# Patient Record
Sex: Female | Born: 1950 | Marital: Married | Smoking: Former smoker
Health system: Southern US, Community
[De-identification: ages and names within clinical notes are randomized; demographics above are authoritative.]

## PROBLEM LIST (undated history)

## (undated) DIAGNOSIS — E049 Nontoxic goiter, unspecified: Principal | ICD-10-CM

## (undated) HISTORY — PX: TONSILECTOMY, ADENOIDECTOMY, BILATERAL MYRINGOTOMY AND TUBES: SHX2538

## (undated) HISTORY — DX: Nontoxic goiter, unspecified: E04.9

## (undated) HISTORY — PX: DG GALL BLADDER: HXRAD326

---

## 2013-06-20 ENCOUNTER — Emergency Department: Payer: Self-pay | Admitting: Emergency Medicine

## 2013-06-29 ENCOUNTER — Inpatient Hospital Stay: Payer: Self-pay | Admitting: Internal Medicine

## 2013-06-29 LAB — CBC
HGB: 12.5 g/dL (ref 12.0–16.0)
MCH: 31.1 pg (ref 26.0–34.0)
MCHC: 34 g/dL (ref 32.0–36.0)
MCV: 92 fL (ref 80–100)
Platelet: 362 10*3/uL (ref 150–440)
RBC: 4.02 10*6/uL (ref 3.80–5.20)
RDW: 13 % (ref 11.5–14.5)
WBC: 19.4 10*3/uL — ABNORMAL HIGH (ref 3.6–11.0)

## 2013-06-29 LAB — COMPREHENSIVE METABOLIC PANEL
Albumin: 3.3 g/dL — ABNORMAL LOW (ref 3.4–5.0)
Alkaline Phosphatase: 103 U/L (ref 50–136)
Anion Gap: 7 (ref 7–16)
Chloride: 103 mmol/L (ref 98–107)
Creatinine: 1.15 mg/dL (ref 0.60–1.30)
EGFR (African American): >60
Glucose: 106 mg/dL — ABNORMAL HIGH (ref 65–99)
Osmolality: 278 (ref 275–301)
Potassium: 2.7 mmol/L — ABNORMAL LOW (ref 3.5–5.1)
Total Protein: 6.9 g/dL (ref 6.4–8.2)

## 2013-06-29 LAB — CBC WITH DIFFERENTIAL/PLATELET
HCT: 35.5 % (ref 35.0–47.0)
HGB: 11.9 g/dL — ABNORMAL LOW (ref 12.0–16.0)
Lymphocyte #: 2 10*3/uL (ref 1.0–3.6)
Lymphocyte %: 11.1 %
MCH: 31.4 pg (ref 26.0–34.0)
MCHC: 33.6 g/dL (ref 32.0–36.0)
MCV: 93 fL (ref 80–100)
Monocyte #: 0.7 x10 3/mm (ref 0.2–0.9)
Monocyte %: 3.6 %
Neutrophil #: 15.4 10*3/uL — ABNORMAL HIGH (ref 1.4–6.5)
Neutrophil %: 83.9 %
Platelet: 356 10*3/uL (ref 150–440)
RBC: 3.8 10*6/uL (ref 3.80–5.20)
WBC: 18.3 10*3/uL — ABNORMAL HIGH (ref 3.6–11.0)

## 2013-06-29 LAB — SEDIMENTATION RATE: Erythrocyte Sed Rate: 23 mm/hr (ref 0–30)

## 2013-06-30 LAB — CBC WITH DIFFERENTIAL/PLATELET
Basophil #: 0 10*3/uL (ref 0.0–0.1)
Basophil %: 0.4 %
Eosinophil #: 0.3 10*3/uL (ref 0.0–0.7)
HCT: 35.5 % (ref 35.0–47.0)
MCH: 31.6 pg (ref 26.0–34.0)
MCHC: 34.4 g/dL (ref 32.0–36.0)
Monocyte %: 5.6 %
Neutrophil #: 9.6 10*3/uL — ABNORMAL HIGH (ref 1.4–6.5)
Neutrophil %: 71.9 %
Platelet: 342 10*3/uL (ref 150–440)
RBC: 3.86 10*6/uL (ref 3.80–5.20)

## 2013-06-30 LAB — BASIC METABOLIC PANEL
Anion Gap: 6 — ABNORMAL LOW (ref 7–16)
BUN: 8 mg/dL (ref 7–18)
Calcium, Total: 8.5 mg/dL (ref 8.5–10.1)
Chloride: 106 mmol/L (ref 98–107)
Creatinine: 0.9 mg/dL (ref 0.60–1.30)
EGFR (African American): >60
EGFR (Non-African Amer.): >60
Glucose: 85 mg/dL (ref 65–99)
Osmolality: 281 (ref 275–301)
Potassium: 2.7 mmol/L — ABNORMAL LOW (ref 3.5–5.1)

## 2013-07-01 LAB — MAGNESIUM: Magnesium: 1.6 mg/dL — ABNORMAL LOW

## 2013-07-01 LAB — VANCOMYCIN, TROUGH: Vancomycin, Trough: 11 ug/mL (ref 10–20)

## 2013-07-01 LAB — POTASSIUM: Potassium: 2.9 mmol/L — ABNORMAL LOW (ref 3.5–5.1)

## 2013-07-02 DIAGNOSIS — Z01810 Encounter for preprocedural cardiovascular examination: Secondary | ICD-10-CM

## 2013-07-02 LAB — BASIC METABOLIC PANEL
Chloride: 104 mmol/L (ref 98–107)
Co2: 28 mmol/L (ref 21–32)
EGFR (African American): >60
Osmolality: 276 (ref 275–301)

## 2013-07-02 LAB — WBC: WBC: 10.7 10*3/uL (ref 3.6–11.0)

## 2013-07-03 LAB — CBC WITH DIFFERENTIAL/PLATELET
Basophil %: 1.2 %
Eosinophil #: 0.5 10*3/uL (ref 0.0–0.7)
HCT: 38.4 % (ref 35.0–47.0)
HGB: 13.1 g/dL (ref 12.0–16.0)
MCHC: 34 g/dL (ref 32.0–36.0)
MCV: 93 fL (ref 80–100)
Monocyte #: 0.7 x10 3/mm (ref 0.2–0.9)
Monocyte %: 7.7 %
Neutrophil %: 61 %
RDW: 13.2 % (ref 11.5–14.5)

## 2013-07-04 LAB — WOUND CULTURE

## 2013-07-07 LAB — WOUND CULTURE

## 2015-02-02 NOTE — Consult Note (Signed)
PATIENT NAME:  Linda Linda [name], Linda Linda [name] MR#:  [id number] DATE OF BIRTH:  06-23-1951  DATE OF CONSULTATION:  06/30/2013  CONSULTING PHYSICIAN:  Jill AlexandersJustin Linda [name]. Ether GriffinsFowler, DPM  REASON FOR CONSULTATION: Right great toe abscess.   HISTORY OF PRESENT ILLNESS: This is Linda [name] 64 year old female admitted with cellulitis to the  right great toe. She states it just came on about 3 days ago; she noticed redness and swelling to the right great toe. She apparently was on Bactrim therapy through the ER recently. I was consulted for possible abscess to this right great toe area.   PAST MEDICAL HISTORY: Type 2 diabetes, asthma, fibromyalgia.   HOME MEDICATIONS: Percocet, recent Bactrim.   ALLERGIES: CODEINE AND LATEX.   SOCIAL HISTORY: She denies smoking or alcohol. She is currently homeless and living in [name] shelter for the last 5 weeks.   REVIEW OF SYSTEMS: No fever, chills, shortness of breath.   PHYSICAL EXAMINATION:  GENERAL: She is alert and oriented.  VASCULAR: She has fully palpable DP and PT pulses to her bilateral feet.  NEUROLOGIC: She is fully sensate to the lower extremities. DERMATOLOGIC: On the dorsal aspect of her right great toe, she has Linda [name] noticed focal abscessed area with surrounding cellulitis and erythema to the great toe. There is Linda [name] central scab in the area. She is extremely tender with any attempted palpation to the region. She has diffuse edema to this right lower extremity as well.  MUSCULOSKELETAL: No pain on range of motion in ankle subtalar joint, but extreme pain with range of motion of the right great toe.   RADIOLOGIC DATA: X-rays were negative for any fracture or gas in the soft tissues.   ASSESSMENT: Right great toe abscess.   PLAN: I discussed performing Linda [name] bedside I and D with the patient and consent has been given.     ____________________________ Linda DonovanJustin Linda [name]. Ether GriffinsFowler, DPM jaf:np D: 06/30/2013 18:22:00 ET T: 06/30/2013 19:12:36 ET JOB#: [id number]  cc: Jill AlexandersJustin Linda [name]. Ether GriffinsFowler, DPM,  <Dictator> Minerva Bluett DPM ELECTRONICALLY SIGNED 07/03/2013 8:01

## 2015-02-02 NOTE — Op Note (Signed)
PATIENT NAME:  [name], [name] MR#:  [id number] DATE OF BIRTH:  [birth year]  DATE OF PROCEDURE:  06/30/2013  DIAGNOSIS:  Right great toe abscess.  PROCEDURE: Incision and drainage.  DESCRIPTION OF PROCEDURE: The right great toe was anesthetized with a total of 3 mL of 1% lidocaine with epinephrine 1:200,000. It was then prepped and draped sterilely by myself. After sterile prep and drape and adequate anesthesia, I made a very small 1.5 cm dorsal incision overlying the focal abscess that is basically at the IPJ of the great toe. I expressed a small amount of purulent drainage. A culture was performed to this deeper area at this time.   I was able to dissect medial and lateral, superficial and somewhat beneath the fascial layer today. I was able to express slightly more purulence from the area. At this time, it was flushed with copious amounts of irrigation and a wet-to-dry dressing was placed overlying the site to allow further drainage. Will have dressing changes performed. She is currently on vancomycin. We will await the culture results and evaluate the great toe. If we see any worsening, we may take her up to the operating room for formal I and D. I will check on her over the next couple of days to reassess her at that time.    ____________________________ Argentina DonovanJustin A. Ether GriffinsFowler, DPM jaf:NTS D: 06/30/2013 18:22:00 ET T: 07/01/2013 10:12:01 ET JOB#: [id number]  cc: Jill AlexandersJustin A. Ether GriffinsFowler, DPM, <Dictator> Freddy Kinne DPM ELECTRONICALLY SIGNED 07/03/2013 8:01

## 2015-02-02 NOTE — Op Note (Signed)
PATIENT NAME:  Linda [name], Linda [name] MR#:  [id number] DATE OF BIRTH:  1951/02/02  DATE OF PROCEDURE:  07/03/2013  PREOPERATIVE [id number]: Right great toe abscess, with Methicillin-resistant Staphylococcus aureus.   POSTOPERATIVE DIAGNOSIS: Right great toe abscess, with Methicillin-resistant Staphylococcus aureus.   PROCEDURE: Open irrigation and drainage abscess, right great toe.   SURGEON: Mase Dhondt [name]. Ether GriffinsFowler, DPM.   ANESTHESIA: IV sedation, with local.   HEMOSTASIS: None.   COMPLICATIONS: None.   SPECIMEN: Wound culture.   OPERATIVE INDICATIONS: This is [name] 64 year old female who presented to the ER with an abscess of her right great toe. We performed [name] bedside I and D with worsening erythema and purulent drainage, and she was brought in today for surgical drainage. All risks, benefits, alternatives and complications associated with surgery were discussed with the patient and full consent has been given.   OPERATIVE PROCEDURE: The patient was brought into the OR and placed on the operating table in the supine position. IV sedation was administered by the anesthesia team. The right lower extremity was then prepped and draped in the usual sterile fashion. Attention was directed to the dorsal aspect from the IPJ to the MTPJ, where an approximately 4 cm incision was made. Blunt dissection was carried down to the tendon and joint capsule. There were no areas of capsule exposed. There was no bone exposed. The tendon was intact. The infection did not seem to be tracking up the tendon sheath. This was all basically localized to the proximal phalanx area.   Medial and lateral dorsal debridement was then performed. An excisional type of debridement with [name] Versajet was performed, removing all necrotic, infected tissue. The wounds were then flushed with copious amounts of irrigation. The area was packed with quarter-inch plain packing and covered loosely. [name] well-compressive sterile bulky dressing was placed on the  right foot. She will be readmitted to the floor, continued with IV antibiotics until her foot stabilizes, and will likely be discharged in the next 1 to 2 days.    ____________________________ Argentina DonovanJustin [name]. Ether GriffinsFowler, DPM jaf:dm D: 07/03/2013 09:42:29 ET T: 07/03/2013 11:51:07 ET JOB#: [id number]  cc: Jill AlexandersJustin [name]. Ether GriffinsFowler, DPM, <Dictator> Dejanae Helser DPM ELECTRONICALLY SIGNED 07/26/2013 10:53

## 2015-02-02 NOTE — Discharge Summary (Signed)
PATIENT NAME:  [name], [name] MR#:  [id number] DATE OF BIRTH:  [birth year]  DATE OF ADMISSION:  06/29/2013 DATE OF DISCHARGE:  07/04/2013  [facility] OF [facility]TH: At [facility]   PODIATRIST: Justin A. Ether GriffinsFowler, DPM  FINAL DIAGNOSES 1.  Right first toe abscess and cellulitis with methicillin-resistant Staphylococcus aureus.  2.  History of diabetes.  3.  Hypokalemia and hypomagnesemia.  4.  Allergic rhinitis.  5.  Fibromyalgia.   OPERATION PROCEDURE PERFORMED: I and D of the first right toe at bedside and then again in the Operating Room.   MEDICATIONS ON DISCHARGE: Include Percocet 5/325 one tablet every 4 hours as needed for pain, Allegra 180 mg daily, magnesium oxide 400 mg daily, potassium chloride 10 mEq daily, ipratropium nasal spray 2 sprays each nostril twice a day, Bactrim DS 800/160 one tablet twice a day for 7 days.   DIET: Low sodium diet, regular consistency.   ACTIVITY: As tolerated.   FOLLOWUP: With Dr. Ether GriffinsFowler of podiatry in 1 week, [facility].   HOSPITAL COURSE: The patient was admitted September 17th with cellulitis, failed outpatient therapy with Bactrim, initially started on Zosyn and vancomycin in the ER and was just kept on IV vancomycin. Dr. Ether GriffinsFowler did a bedside debridement on September 18th and then had to take the patient to the Operating Room on September 21st. Please see operative reports.   LABORATORY AND RADIOLOGICAL DATA: White blood cell count 19.4, H and H  12.5 and 36.7, platelet count of 362. Glucose 106, BUN 13, creatinine 1.15, sodium 139, potassium 3.7, chloride 103, CO2 29, calcium 8.5. Liver function tests normal range. Hemoglobin A1c was 5.0. X-ray no evidence of osteomyelitis. Wound culture on the 18th showed heavy growth of MRSA but it is sensitive to clindamycin and Bactrim. Magnesium upon discharge 2.4, potassium 4.2. White count 8.4.   HOSPITAL COURSE PER PROBLEM LIST 1.  For the patient's abscess and cellulitis  of the first great toe, the patient was started on IV vancomycin. The patient was having debridement at the bedside on September 18th and then in the Operating Room on September 21st. The patient was on IV vancomycin the entire time here. We will discharge home on oral Bactrim for another week, local wound care as per Dr. Ether GriffinsFowler. The patient will do wet-to-dry dressing daily. Follow up in 1 week with Dr. Ether GriffinsFowler for wound healing.  2.  History of diabetes. Hemoglobin A1c was 5.0. No meds for this.  3.  Hypokalemia and hypomagnesemia. Low-dose potassium and magnesium supplementation upon discharge.  4.  Allergic rhinitis. Allegra and ipratropium nasal spray prescribed.  5.  Fibromyalgia. [facility].   TIME SPENT ON DISCHARGE: 35 minutes.    ____________________________ Herschell Dimesichard J. Renae GlossWieting, MD rjw:cs D: 07/04/2013 15:04:21 ET T: 07/04/2013 15:32:23 ET JOB#: [id number]  cc: Herschell Dimesichard J. Renae GlossWieting, MD, <Dictator> Justin A. Ether GriffinsFowler, DPM [facility] Salley ScarletICHARD J Lavora Brisbon MD ELECTRONICALLY SIGNED 07/07/2013 14:45

## 2015-02-02 NOTE — H&P (Signed)
PATIENT NAME:  [name], [name] MR#:  [id number] DATE OF BIRTH:  [birth year]  DATE OF ADMISSION:  06/29/2013  REFERRING PHYSICIAN: Dr. Marjean Donna.   PRIMARY CARE PHYSICIAN: [facility].    Chief Compliant: Foot pain  HISTORY OF PRESENT ILLNESS: This is a 64 year old Caucasian female with past medical history of diabetes on no medications as well as fibromyalgia who is also homeless, residing at a homeless shelter with the last 5 weeks. She is presenting with left lower extremity and right great toe pain and redness. She has noticed for a 2 week duration increasing painful areas with associated erythematous lesions with purulent drainage described as yellow pus-like with associated dark red blood. It seems that she was seen as an outpatient about 1 week ago for these similar symptoms and given Bactrim therapy. Thus far, after completion of antibiotics, she has noticed no change in her symptoms. She describes the pain as throbbing in nature, worsened with movement. Has found no relieving factors aside from pain medications in the Emergency Department. She denies any fevers or chills.    REVIEW OF SYSTEMS:  CONSTITUTIONAL: Denies fevers, fatigue or weakness. Does have pain as described above.  EYES: Denies blurred vision or eye pain.  ENT: Denies ear pain or discharge or dysphagia.  RESPIRATORY: Denies cough or shortness of breath.  CARDIOVASCULAR: Denies chest pain, palpitations, orthopnea, lower extremity edema.  GASTROINTESTINAL: Denies nausea, vomiting, diarrhea, abdominal pain.  GENITOURINARY: Denies dysuria, hematuria.  ENDOCRINE: Denies nocturia or thyroid problems.  HEMATOLOGIC AND LYMPHATIC: Denies anemia, easy bruising or bleeding.  SKIN: Lesion as described above. No rashes noted.  MUSCULOSKELETAL: Pain as described above. Denies any arthritis or swelling.  NEUROLOGIC: Denies any paralysis or paresthesias.  PSYCHIATRIC: Denies any anxiety or depressive symptoms.   Otherwise, full  review of systems performed by me is negative.   PAST MEDICAL HISTORY: Diabetes type 2 on no medications, asthma, fibromyalgia.   SOCIAL HISTORY: Denies any alcohol, tobacco or drug usage. She is currently homeless, residing in a homeless shelter for the last 5 weeks.   FAMILY HISTORY: Positive for diabetes as well as hypertension and cancer of unknown type.   ALLERGIES: CODEINE AND LATEX. Codeine causing nauseousness.   HOME MEDICATIONS: Include Percocet 5/325 one tab p.o. q.4 hours as needed for pain, and she recently completed Bactrim DS 800 mg/160 mg 2 tabs b.i.d.    PHYSICAL EXAMINATION:  VITAL SIGNS: Temperature 98.7, heart rate 93, respirations 20, blood pressure 130/73, saturating 97% on room air. Weight 77.1 kg, height 65.9 inches, BMI 27.5.  GENERAL: In no acute distress, awake, alert and oriented x3.  HEENT: Normocephalic, atraumatic. Extraocular muscles intact. Pupils equal, round, reactive to light as well as accommodation. Nose: No nasal lesions or drainage. Ears: No drainage or external lesions. Mouth: Moist mucosal membranes.  NECK: Supple, symmetric. Thyroid midline without thyromegaly.  CARDIOVASCULAR: S1, S2, regular rate and rhythm. No murmurs, rubs or gallops.  PULMONARY: Clear to auscultation bilaterally without wheezes, rales, rhonchi.  ABDOMEN: Soft, nontender, nondistended. No hepatosplenomegaly. Positive bowel sounds.  EXTREMITIES: No cyanosis or clubbing. She has full range of motion in all extremities. There is an area on the medial aspect of the left lower extremity which measures approximately 1 x 1 cm with surrounding erythema and edema. There is purulent discharge as well as bloody discharge. The area has been outlined. She also has on the dorsal aspect of her right great toe an area of erythema and edema which is hot and tender  to palpate.  SKIN: Lesions as described above. No diaphoresis.  VASCULAR: DP pulse and PT pulse 2+ bilaterally.  NEUROLOGIC: Cranial  nerves II through XII intact. No gross neurological deficits.  PSYCHIATRIC: Judgment and insight are adequate.   LABORATORY DATA: Sodium 139, potassium 2.7, chloride 103, bicarb 29, BUN 13, creatinine 1.15, glucose 106. Total protein 6.9, albumin 3.3, bili 0.4, alk phos 103, AST 21, ALT 35. WBC 19.4, hemoglobin 12.5, platelets 362.   ASSESSMENT AND PLAN: A 64 year old female with history of diabetes who is on no medications. She is homeless, residing at a homeless shoulder for greater than 5 weeks. Presenting with left lower extremity and right great toe pain and redness. She has failed outpatient therapy with Bactrim.  1. Cellulitis: She has failed outpatient therapy with Bactrim. Will start intravenous vancomycin for antibiotic coverage. She has actually received Zosyn and vancomycin in the Emergency Department. Continue vancomycin for methicillin-resistant Staphylococcus aureus coverage as this is purulent cellulitis. Since she has a foot lesion that has been present for greater than 2 weeks, will check an ESR as well as a foot x-ray. She made eventually require an MRI if she is truly diabetic based off hemoglobin A1c to rule out diabetic foot with osteomyelitis.  2. Diabetes: She is on no medication. Will check hemoglobin A1c. If she is a diabetic, I would be more concerned for osteomyelitis. Will also place on insulin sliding scale.  3. Hypokalemia: Replace potassium.  4. Deep venous thrombosis prophylaxis: Heparin subcutaneous.   She is FULL CODE.   TIME SPENT: 33 minutes.   ____________________________ Aaron Mose. Jeff Frieden, MD dkh:gb D: 06/29/2013 04:53:48 ET T: 06/29/2013 05:14:23 ET JOB#: [id number]  cc: Aaron Mose. Artis Beggs, MD, <Dictator> [place] Ursua Woodfin Ganja MD ELECTRONICALLY SIGNED 07/07/2013 0:09

## 2015-07-23 IMAGING — CR RIGHT GREAT TOE
1 series · 5 of 5 positions shown · non-contrast
Comparison: none

REASON FOR EXAM: cellulitis, osteomyelitis
COMMENTS:

[Series 1: x toes ap right · 0.14mm/px · 5 of 5 slices shown]
[im 1/5]
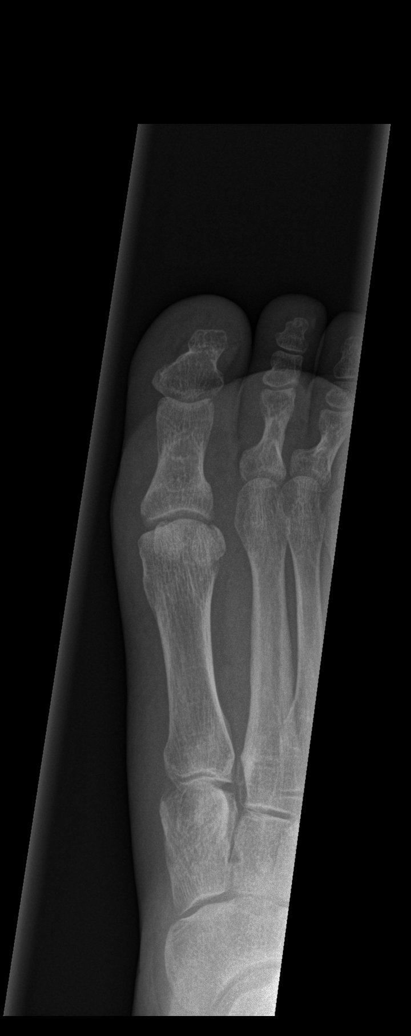
[im 2/5]
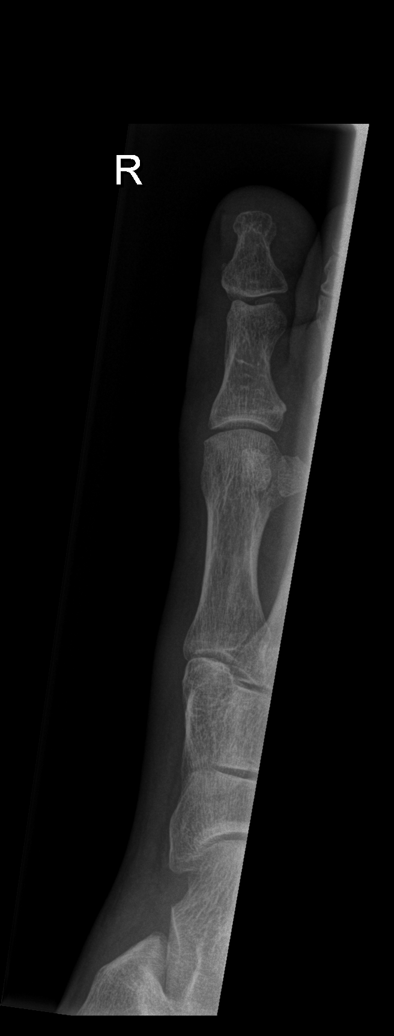
[im 3/5]
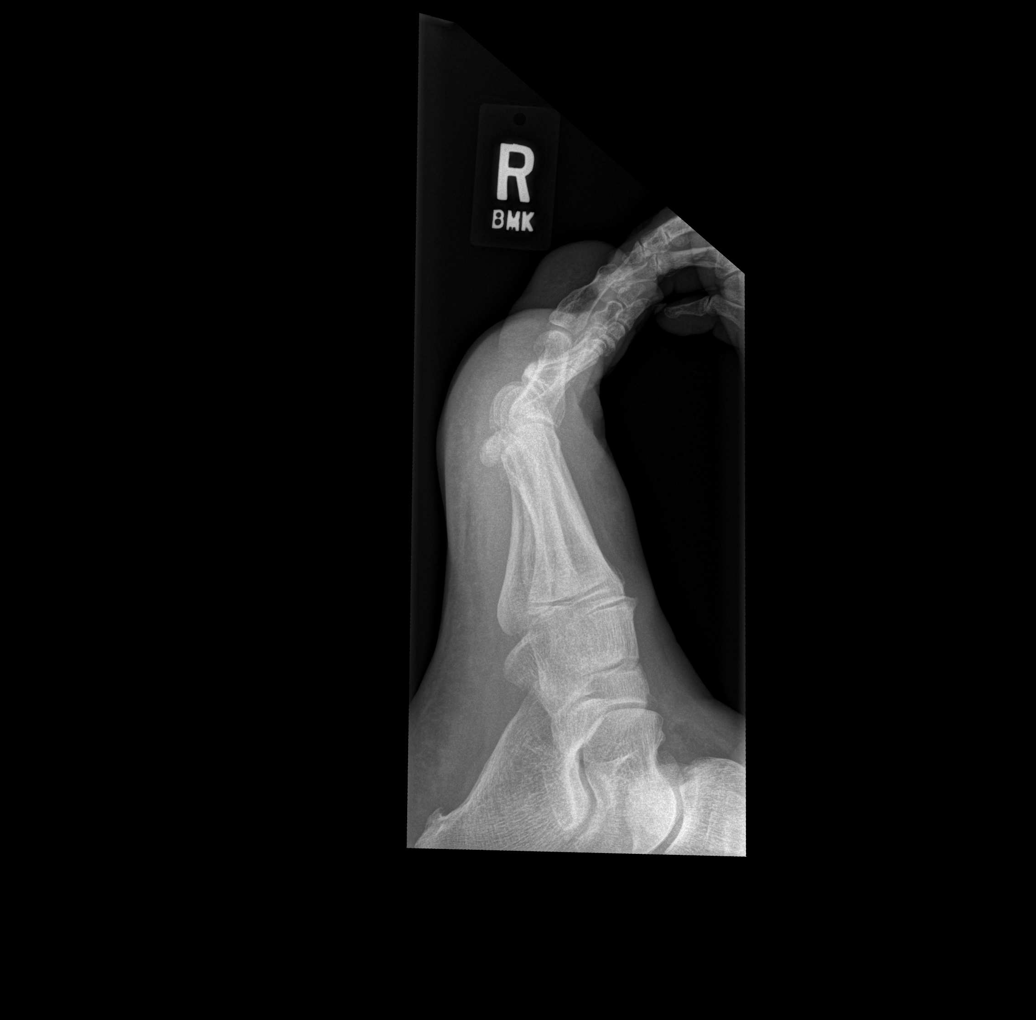
[im 4/5]
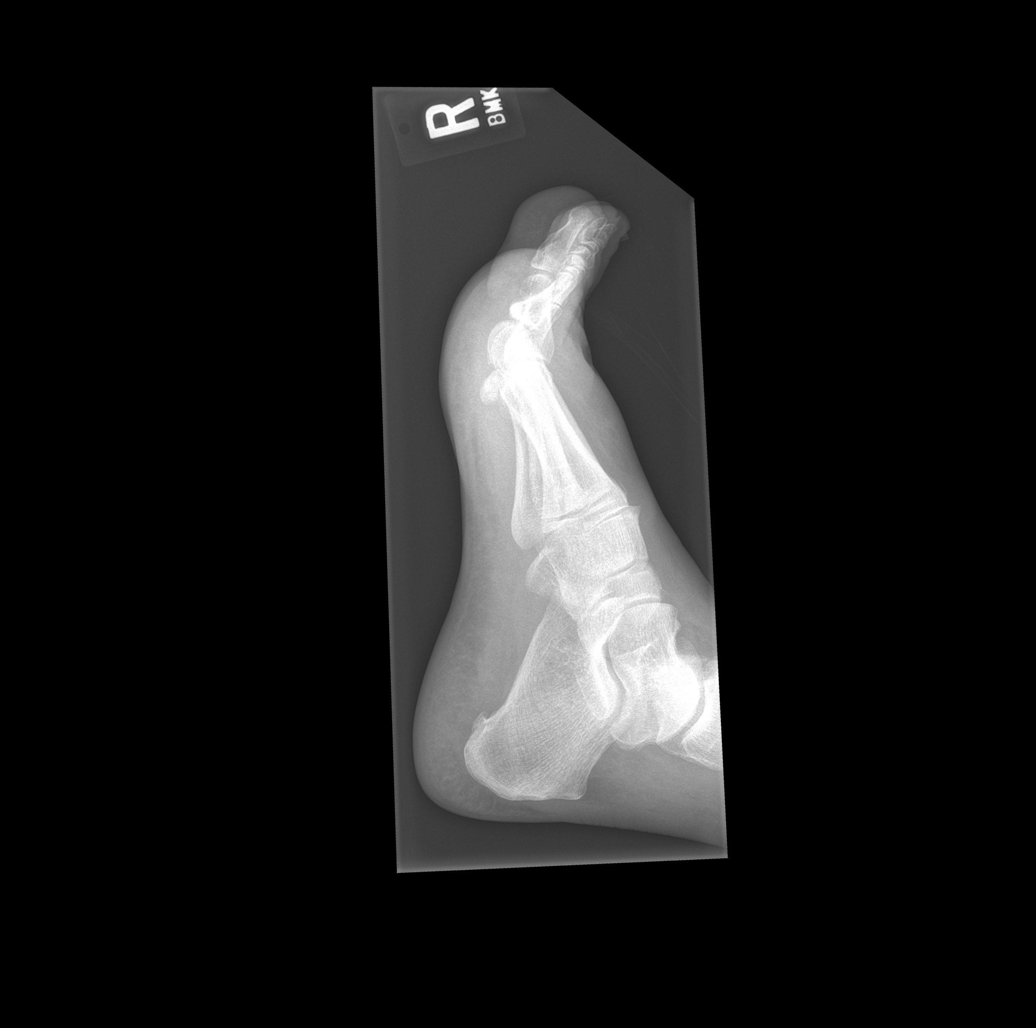
[im 5/5]
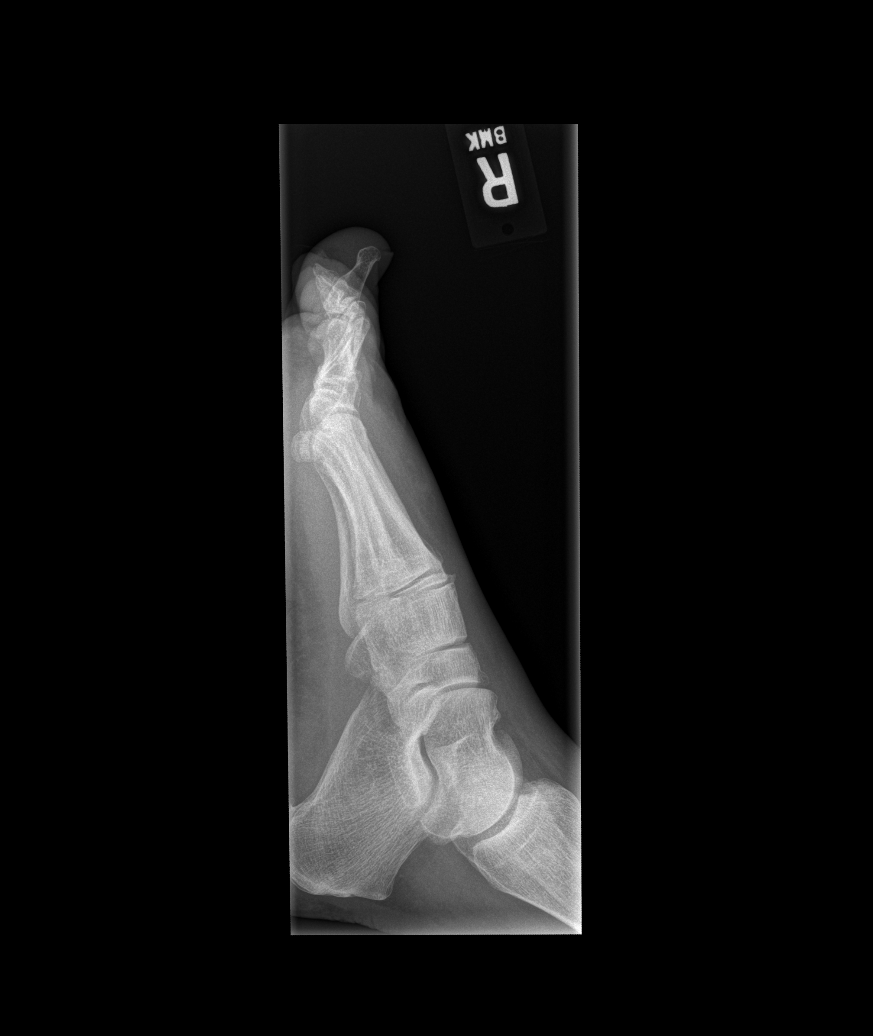

[5 of 5 positions shown; findings below may reference images not displayed]

PROCEDURE:     DXR - DXR TOE GREAT (1ST DIGIT) RT SELAKOVIC  - June 29, 2013  [DATE]

RESULT:     Five views of the right great toe are submitted. The bones
appear reasonably well mineralized. There is no lytic or blastic lesion nor
periosteal reaction. No definite overlying soft tissue abnormality is seen
other than mild edema.
IMPRESSION: There is no radiographic evidence of osteomyelitis.
Cellulitis may be present.

[REDACTED]

## 2016-06-05 ENCOUNTER — Ambulatory Visit: Payer: Self-pay | Admitting: Family Medicine

## 2016-06-05 VITALS — BP 147/92 | HR 84 | Wt 190.0 lb

## 2016-06-05 DIAGNOSIS — E039 Hypothyroidism, unspecified: Secondary | ICD-10-CM

## 2016-06-05 DIAGNOSIS — M545 Low back pain: Secondary | ICD-10-CM

## 2016-06-05 MED ORDER — DICLOFENAC SODIUM 25 MG PO TBEC: 50.0000 mg | DELAYED_RELEASE_TABLET | Freq: Three times a day (TID) | ORAL | Status: DC | PRN

## 2016-06-05 NOTE — Progress Notes (Signed)
Subjective:     Patient ID: Linda PartridgePatricia Ann [name], female   DOB: 02/21/1951, 65 y.o.   MRN: [id number]  HPI.  New pt here.  Had been on thyroid med 2-3 years ago. Since stopping, has gained 60-70 pounds; also very tired. BM and temperature sensitivity are nl. Has dysphagia with liquids and solid food.  Has a h/o asthma, is on no meds.     Review of Systems H/o fibromyalgia Low back pain, with radiating down left leg occasionally    Objective:   Physical Exam OP/Con clear +goiter, right more than left CTA RRR NT/ND +2 pulses, no edema    Assessment:         Plan:     H/o hypothyroidism.  Check TSH.  Dysphagia. May be secondary to goiter.  Wait to see response to thyroid med.  Borderline high Bp.  Will monitor.  RTC 6 weeks

## 2016-06-06 ENCOUNTER — Other Ambulatory Visit: Payer: Self-pay

## 2016-06-06 DIAGNOSIS — M545 Low back pain: Secondary | ICD-10-CM

## 2016-06-06 LAB — TSH: TSH: 30.56 u[IU]/mL — ABNORMAL HIGH (ref 0.450–4.500)

## 2016-06-06 MED ORDER — DICLOFENAC SODIUM 50 MG PO TBEC: 50.0000 mg | DELAYED_RELEASE_TABLET | Freq: Three times a day (TID) | ORAL | 3 refills | Status: DC

## 2016-06-12 ENCOUNTER — Telehealth: Payer: Self-pay | Admitting: Nurse Practitioner

## 2016-06-12 NOTE — Telephone Encounter (Signed)
Patient had an appt on 06/05/16 and was prescribed a thyroid prescription and it has not been filled yet.

## 2016-06-17 ENCOUNTER — Telehealth: Payer: Self-pay

## 2016-06-17 NOTE — Telephone Encounter (Signed)
Pt had called checking on her thyroid medication that was prescribed at her last Ov. Informed pt that the doctor has been sent a note and will send the rx in tonight. Pt verbalized understanding.

## 2016-06-24 ENCOUNTER — Other Ambulatory Visit: Payer: Self-pay | Admitting: Nurse Practitioner

## 2016-06-24 MED ORDER — LEVOTHYROXINE SODIUM 175 MCG PO TABS: ORAL_TABLET | ORAL | 1 refills | Status: DC

## 2016-06-24 NOTE — Progress Notes (Signed)
l °

## 2016-07-18 DIAGNOSIS — E049 Nontoxic goiter, unspecified: Secondary | ICD-10-CM

## 2016-07-18 HISTORY — DX: Nontoxic goiter, unspecified: E04.9

## 2016-07-23 ENCOUNTER — Ambulatory Visit: Payer: Self-pay | Admitting: Internal Medicine

## 2016-07-23 ENCOUNTER — Encounter: Payer: Self-pay | Admitting: Internal Medicine

## 2016-07-23 ENCOUNTER — Other Ambulatory Visit: Payer: Self-pay

## 2016-07-23 VITALS — HR 80 | Temp 98.4°F | Wt 204.0 lb

## 2016-07-23 DIAGNOSIS — E039 Hypothyroidism, unspecified: Secondary | ICD-10-CM

## 2016-07-23 DIAGNOSIS — E049 Nontoxic goiter, unspecified: Secondary | ICD-10-CM

## 2016-07-23 MED ORDER — LEVOTHYROXINE SODIUM 175 MCG PO TABS: ORAL_TABLET | ORAL | 1 refills | Status: DC

## 2016-07-23 NOTE — Progress Notes (Signed)
   Subjective:    Patient ID: Linda [name], female    DOB: Nov 19, 1950, 65 y.o.   MRN: [id number]  HPI  Pt presents for f/u of hypothyroidism. Has been on 1/2 dose of medication for 1 month. Her weight has increased 14lbs in 4 weeks.  Reports sebaceous cyst on her back.   Patient Active Problem List   Diagnosis Date Noted  . Goiter 07/18/2016     Medication List       Accurate as of 07/23/16 11:26 AM. Always use your most recent med list.          diclofenac 50 MG EC tablet Commonly known as:  VOLTAREN Take 1 tablet (50 mg total) by mouth 3 (three) times daily.   levothyroxine 175 MCG tablet Commonly known as:  SYNTHROID, LEVOTHROID 1/2 tablet by mouth each day, recheck tsh in one month          Review of Systems      Objective:   Physical Exam  Constitutional: She is oriented to person, place, and time.  Cardiovascular: Normal rate, regular rhythm and normal heart sounds.   Pulmonary/Chest: Effort normal and breath sounds normal.  Neurological: She is alert and oriented to person, place, and time.    Pulse 80   Temp 98.4 F (36.9 C) (Oral)   Wt 204 lb (92.5 kg)        Assessment & Plan:   Labs today: Met C, CBC, Ua, Lipid, TSH, T4  F/u in 4 weeks w/ TSH on return

## 2016-07-23 NOTE — Patient Instructions (Signed)
Labs today: Met C, CBC, Ua, Lipid, TSH, T4  F/u in 4 weeks w/ TSH on return

## 2016-07-24 LAB — URINALYSIS
BILIRUBIN UA: NEGATIVE
GLUCOSE, UA: NEGATIVE
KETONES UA: NEGATIVE
Nitrite, UA: NEGATIVE
PH UA: 6 (ref 5.0–7.5)
PROTEIN UA: NEGATIVE
RBC UA: NEGATIVE
SPEC GRAV UA: 1.025 (ref 1.005–1.030)
Urobilinogen, Ur: 0.2 mg/dL (ref 0.2–1.0)

## 2016-07-24 LAB — COMPREHENSIVE METABOLIC PANEL
A/G RATIO: 1.3 (ref 1.2–2.2)
ALT: 27 IU/L (ref 0–32)
AST: 18 IU/L (ref 0–40)
Albumin: 4 g/dL (ref 3.6–4.8)
Alkaline Phosphatase: 112 IU/L (ref 39–117)
BILIRUBIN TOTAL: 0.3 mg/dL (ref 0.0–1.2)
BUN/Creatinine Ratio: 19 (ref 12–28)
BUN: 17 mg/dL (ref 8–27)
CHLORIDE: 101 mmol/L (ref 96–106)
CO2: 25 mmol/L (ref 18–29)
Calcium: 9.1 mg/dL (ref 8.7–10.3)
Creatinine, Ser: 0.91 mg/dL (ref 0.57–1.00)
GFR calc non Af Amer: 67 mL/min/{1.73_m2} (ref >59)
GFR, EST AFRICAN AMERICAN: 78 mL/min/{1.73_m2} (ref >59)
GLOBULIN, TOTAL: 3.1 g/dL (ref 1.5–4.5)
Glucose: 98 mg/dL (ref 65–99)
POTASSIUM: 4.5 mmol/L (ref 3.5–5.2)
SODIUM: 138 mmol/L (ref 134–144)
TOTAL PROTEIN: 7.1 g/dL (ref 6.0–8.5)

## 2016-07-24 LAB — LIPID PANEL
CHOLESTEROL TOTAL: 224 mg/dL — AB (ref 100–199)
Chol/HDL Ratio: 5.5 ratio units — ABNORMAL HIGH (ref 0.0–4.4)
HDL: 41 mg/dL (ref >39)
LDL Calculated: 152 mg/dL — ABNORMAL HIGH (ref 0–99)
Triglycerides: 154 mg/dL — ABNORMAL HIGH (ref 0–149)
VLDL CHOLESTEROL CAL: 31 mg/dL (ref 5–40)

## 2016-07-24 LAB — CBC
HEMOGLOBIN: 14.7 g/dL (ref 11.1–15.9)
Hematocrit: 44.5 % (ref 34.0–46.6)
MCH: 30.1 pg (ref 26.6–33.0)
MCHC: 33 g/dL (ref 31.5–35.7)
MCV: 91 fL (ref 79–97)
PLATELETS: 317 10*3/uL (ref 150–379)
RBC: 4.88 x10E6/uL (ref 3.77–5.28)
RDW: 13.4 % (ref 12.3–15.4)
WBC: 7.8 10*3/uL (ref 3.4–10.8)

## 2016-07-24 LAB — T4: T4 TOTAL: 11.7 ug/dL (ref 4.5–12.0)

## 2016-07-24 LAB — TSH: TSH: 0.166 u[IU]/mL — AB (ref 0.450–4.500)

## 2016-08-05 ENCOUNTER — Encounter (INDEPENDENT_AMBULATORY_CARE_PROVIDER_SITE_OTHER): Payer: Self-pay

## 2016-08-05 ENCOUNTER — Ambulatory Visit: Payer: Self-pay | Admitting: Pharmacy Technician

## 2016-08-05 ENCOUNTER — Other Ambulatory Visit: Payer: Self-pay | Admitting: Internal Medicine

## 2016-08-05 DIAGNOSIS — Z79899 Other long term (current) drug therapy: Secondary | ICD-10-CM

## 2016-08-14 NOTE — Progress Notes (Signed)
Completed [facility] application and contract.  Patient agreed to all terms of the [facility] contract.  Provided patient with community resource material based on her particular needs.    Betty J. Kluttz Care Manager [facility]  

## 2016-08-20 ENCOUNTER — Other Ambulatory Visit: Payer: Self-pay

## 2016-08-20 DIAGNOSIS — E049 Nontoxic goiter, unspecified: Secondary | ICD-10-CM

## 2016-08-21 LAB — TSH: TSH: 0.023 u[IU]/mL — ABNORMAL LOW (ref 0.450–4.500)

## 2016-09-10 ENCOUNTER — Encounter: Payer: Self-pay | Admitting: Internal Medicine

## 2016-09-10 ENCOUNTER — Ambulatory Visit: Payer: Self-pay | Admitting: Internal Medicine

## 2016-09-10 VITALS — BP 148/93 | HR 89 | Temp 98.2°F | Wt 204.0 lb

## 2016-09-10 DIAGNOSIS — E0590 Thyrotoxicosis, unspecified without thyrotoxic crisis or storm: Secondary | ICD-10-CM

## 2016-09-10 MED ORDER — LEVOTHYROXINE SODIUM 100 MCG PO TABS: 100.0000 ug | ORAL_TABLET | Freq: Every day | ORAL | 3 refills | Status: DC

## 2016-09-10 MED ORDER — CETIRIZINE HCL 10 MG PO CAPS
10.0000 mg | ORAL_CAPSULE | Freq: Every day | ORAL | 3 refills | Status: DC
Start: 2016-09-10 — End: 2017-03-17

## 2016-09-10 NOTE — Patient Instructions (Signed)
Labs for TSH in 4 weeks.

## 2016-09-10 NOTE — Progress Notes (Signed)
   Subjective:    Patient ID: Linda [name], female    DOB: 08/10/1951, 65 y.o.   MRN: [id number]  HPI BP (!) 148/93   Pulse 89   Temp 98.2 F (36.8 C) (Oral)   Wt 204 lb (92.5 kg)   LMP  (Approximate)     Medication List       Accurate as of 09/10/16 11:49 AM. Always use your most recent med list.          diclofenac 50 MG EC tablet Commonly known as:  VOLTAREN Take 1 tablet (50 mg total) by mouth 3 (three) times daily.   levothyroxine 175 MCG tablet Commonly known as:  SYNTHROID, LEVOTHROID TAKE 1 TABLET BY MOUTH DAILY. RECHECK TSH IN ONE MONTH.      Pt presents with thyroid problems. Pt c/o of back pain. Pt taking paid med and states its not working. Pt c/o of asthma and allergies. Cyst on back, has had lanced twice.    Review of Systems     Objective:   Physical Exam  Constitutional: She is oriented to person, place, and time.  Cardiovascular: Normal rate, regular rhythm and normal heart sounds.   Pulmonary/Chest: Effort normal and breath sounds normal.  Neurological: She is alert and oriented to person, place, and time. She has normal reflexes.          Assessment & Plan:  Thyroid med is to strong for pt. Will reduce strength. Blood test shows TSH levels to low. Zyrtec and synthroid RX written. F/u Tsh in 4 weeks. Pt can take a 1/2 of synthroid until new RX comes in. Watch portions and exercise to lose weight.

## 2016-10-08 ENCOUNTER — Other Ambulatory Visit: Payer: Self-pay

## 2016-10-08 DIAGNOSIS — E039 Hypothyroidism, unspecified: Secondary | ICD-10-CM

## 2016-10-09 LAB — TSH: TSH: 0.009 u[IU]/mL — AB (ref 0.450–4.500)

## 2016-10-15 ENCOUNTER — Telehealth: Payer: Self-pay

## 2016-10-15 NOTE — Telephone Encounter (Signed)
Called pt to ask questions from dr chaplin about thyroid med. Her labs are showing she is on to high of a dose now. He wants to know if she is taking more of her rx than prescribed or if she the dosage has been changed without his knowledge? Pt stated she is taking the 100 mg that chaplin prescribed. Pt wants to know what she needs to do. Will call chaplin and find out?

## 2016-10-22 ENCOUNTER — Telehealth: Payer: Self-pay

## 2016-10-22 NOTE — Telephone Encounter (Signed)
Called pt to have her bring in thyroid medication so we can make sure it is the 100 mg tablet. Linda [name] will adjust the dose if the labs are still to high.

## 2016-11-12 ENCOUNTER — Other Ambulatory Visit: Payer: Self-pay

## 2016-11-12 DIAGNOSIS — E079 Disorder of thyroid, unspecified: Secondary | ICD-10-CM

## 2016-11-12 DIAGNOSIS — E0590 Thyrotoxicosis, unspecified without thyrotoxic crisis or storm: Secondary | ICD-10-CM

## 2016-11-12 NOTE — Progress Notes (Unsigned)
Per chaplin Pt rtc in 2 weeks for redraw for tsh and T4. Pt to stop med until lab work comes back In two weeks. Pt to bring pills in with her at lab appt. Rtc in 3 wks to see chaplin. PT verbalized understanding.

## 2016-11-13 LAB — T4, FREE: Free T4: 1.55 ng/dL (ref 0.82–1.77)

## 2016-11-13 LAB — TSH: TSH: 0.012 u[IU]/mL — AB (ref 0.450–4.500)

## 2016-11-26 ENCOUNTER — Other Ambulatory Visit: Payer: Self-pay

## 2016-11-26 DIAGNOSIS — E079 Disorder of thyroid, unspecified: Secondary | ICD-10-CM

## 2016-11-27 LAB — TSH: TSH: 2.55 u[IU]/mL (ref 0.450–4.500)

## 2016-11-27 LAB — T4, FREE: Free T4: 0.54 ng/dL — ABNORMAL LOW (ref 0.82–1.77)

## 2016-12-03 ENCOUNTER — Encounter: Payer: Self-pay | Admitting: Internal Medicine

## 2016-12-03 ENCOUNTER — Ambulatory Visit: Payer: Self-pay | Admitting: Internal Medicine

## 2016-12-03 VITALS — BP 164/104 | HR 80 | Temp 97.8°F | Wt 211.6 lb

## 2016-12-03 DIAGNOSIS — E0590 Thyrotoxicosis, unspecified without thyrotoxic crisis or storm: Secondary | ICD-10-CM

## 2016-12-03 DIAGNOSIS — L723 Sebaceous cyst: Secondary | ICD-10-CM

## 2016-12-03 DIAGNOSIS — E209 Hypoparathyroidism, unspecified: Secondary | ICD-10-CM

## 2016-12-03 MED ORDER — LEVOTHYROXINE SODIUM 100 MCG PO TABS: 50.0000 ug | ORAL_TABLET | Freq: Every day | ORAL | 3 refills | Status: DC

## 2016-12-03 NOTE — Progress Notes (Signed)
   Subjective:    Patient ID: Asencion PartridgePatricia Ann Bunch, female    DOB: 10/08/1951, 66 y.o.   MRN: [id number]  HPI   Pt f/u for TSH levels Pt reports sebaceous cyst on L shoulder blade is painful.   Patient Active Problem List   Diagnosis Date Noted  . Goiter 07/18/2016   Allergies as of 12/03/2016      Reactions   Codeine Nausea And Vomiting   Latex    Pollen Extract       Medication List       Accurate as of 12/03/16 11:35 AM. Always use your most recent med list.          Cetirizine HCl 10 MG Caps Take 1 capsule (10 mg total) by mouth daily.        Review of Systems     Objective:   Physical Exam  Constitutional: She is oriented to person, place, and time.  Cardiovascular: Normal rate, regular rhythm and normal heart sounds.   Pulmonary/Chest: Effort normal and breath sounds normal.  Neurological: She is alert and oriented to person, place, and time.    BP (!) 164/104   Pulse 80   Temp 97.8 F (36.6 C) (Oral)   Wt 211 lb 9.6 oz (96 kg)   Sebaceous cysts located on L shoulder blade. Needs to be removed.   Pt will take 50 mg of levothyroxine until next apt   Will evaluate elevated bp again at f/u    Assessment & Plan:   F/u in 2 weeks with labs: Free T4, TSH Referral to general surgeon for removal of sebaceous cyst on L shoulder blade  Pt will take 50 mg of levothyroxine until next apt. Did not reorder med for right now. Will evaluate at f/u

## 2016-12-03 NOTE — Patient Instructions (Signed)
F/u in 2 weeks w/ labs Referral to general surgeon for removal of sebaceous cyst  Take 50 mg (half of a pill) of levothyroxine for next 2 weeks until next appointment

## 2016-12-17 ENCOUNTER — Other Ambulatory Visit: Payer: Self-pay

## 2016-12-17 DIAGNOSIS — E209 Hypoparathyroidism, unspecified: Secondary | ICD-10-CM

## 2016-12-18 LAB — T4, FREE: Free T4: 0.68 ng/dL — ABNORMAL LOW (ref 0.82–1.77)

## 2016-12-18 LAB — TSH: TSH: 19.35 u[IU]/mL — AB (ref 0.450–4.500)

## 2016-12-24 ENCOUNTER — Ambulatory Visit: Payer: Self-pay | Admitting: Internal Medicine

## 2016-12-24 ENCOUNTER — Encounter: Payer: Self-pay | Admitting: Internal Medicine

## 2016-12-24 VITALS — BP 144/94 | HR 90 | Temp 97.9°F | Wt 210.5 lb

## 2016-12-24 DIAGNOSIS — E0590 Thyrotoxicosis, unspecified without thyrotoxic crisis or storm: Secondary | ICD-10-CM

## 2016-12-24 DIAGNOSIS — E039 Hypothyroidism, unspecified: Secondary | ICD-10-CM

## 2016-12-24 MED ORDER — LEVOTHYROXINE SODIUM 100 MCG PO TABS: 100.0000 ug | ORAL_TABLET | Freq: Every day | ORAL | 3 refills | Status: DC

## 2016-12-24 NOTE — Patient Instructions (Signed)
Labs in 2 weeks Increase levothyroxine to 100mg /day

## 2016-12-24 NOTE — Progress Notes (Signed)
   Subjective:    Patient ID: Linda PartridgePatricia Ann [name], female    DOB: 05/28/1951, 66 y.o.   MRN: [id number]  HPI   Pt f/u for thyroid medication  Patient Active Problem List   Diagnosis Date Noted  . Goiter 07/18/2016   Allergies as of 12/24/2016      Reactions   Codeine Nausea And Vomiting   Latex    Pollen Extract       Medication List       Accurate as of 12/24/16 11:37 AM. Always use your most recent med list.          Cetirizine HCl 10 MG Caps Take 1 capsule (10 mg total) by mouth daily.   levothyroxine 100 MCG tablet Commonly known as:  SYNTHROID, LEVOTHROID Take 0.5 tablets (50 mcg total) by mouth daily before breakfast.        Review of Systems  Thyroid levels have decreased to hypo     Objective:   Physical Exam  Constitutional: She is oriented to person, place, and time.  Cardiovascular: Normal rate, regular rhythm and normal heart sounds.   Pulmonary/Chest: Effort normal and breath sounds normal.  Neurological: She is alert and oriented to person, place, and time.    BP (!) 144/94   Pulse 90   Temp 97.9 F (36.6 C) (Oral)   Wt 210 lb 8 oz (95.5 kg)        Assessment & Plan:   Labs in 2 weeks: T4 and TSH  Increase levothyroxine to 100mg /day.  Plan to do labs every 2 weeks, titrateting her medication. Will schedule f/u once we get her medication close to target.

## 2017-01-07 ENCOUNTER — Other Ambulatory Visit: Payer: Self-pay

## 2017-01-07 DIAGNOSIS — E039 Hypothyroidism, unspecified: Secondary | ICD-10-CM

## 2017-01-08 ENCOUNTER — Ambulatory Visit: Payer: Self-pay | Admitting: Urology

## 2017-01-08 VITALS — Temp 98.2°F | Wt 206.1 lb

## 2017-01-08 DIAGNOSIS — K068 Other specified disorders of gingiva and edentulous alveolar ridge: Secondary | ICD-10-CM

## 2017-01-08 LAB — T4: T4, Total: 10 ug/dL (ref 4.5–12.0)

## 2017-01-08 LAB — TSH: TSH: 0.777 u[IU]/mL (ref 0.450–4.500)

## 2017-01-08 NOTE — Progress Notes (Signed)
Within normal limits

## 2017-01-08 NOTE — Progress Notes (Signed)
  Patient: Linda [name] Female    DOB: 12/15/1950   66 y.o.   MRN: [id number] Visit Date: 01/08/2017  Today's Provider: [facility]   Chief Complaint  Patient presents with  . Sore Throat   Subjective:    HPI 66 yo WF with a knot on the inside of throat.    She noticed it two weeks ago when she had a sore throat.  She has not seen any drainage or blood from the lesion.  She has poor dentition and is a former smoker.    Allergies  Allergen Reactions  . Codeine Nausea And Vomiting  . Latex   . Pollen Extract    Previous Medications   CETIRIZINE HCL 10 MG CAPS    Take 1 capsule (10 mg total) by mouth daily.   LEVOTHYROXINE (SYNTHROID, LEVOTHROID) 100 MCG TABLET    Take 1 tablet (100 mcg total) by mouth daily before breakfast.    Review of Systems  Social History  Substance Use Topics  . Smoking status: Never Smoker  . Smokeless tobacco: Never Used  . Alcohol use Not on file   Objective:   Temp 98.2 F (36.8 C)   Wt 206 lb 1.6 oz (93.5 kg)   Physical Exam  HENT:  Mouth/Throat: Uvula is midline, oropharynx is clear and moist and mucous membranes are normal.  Poor dentition.  Teeth missing.  A 7 mm x 5 mm lesion on the inside of the upper left gum at the molar.  Tender to palpation.  No pus expressed with pressure.   Red in color.    No cervical lymphadenopathy      Assessment & Plan:   1. Gum lesion  - refer to ENT for further evaluation - former smoker - concern for oral cancer   2. Hypothyroidism   - keep upcoming appointment      [facility]   [facility] [place]

## 2017-01-09 ENCOUNTER — Telehealth: Payer: Self-pay

## 2017-01-09 NOTE — Telephone Encounter (Signed)
Called pt with results of TSH. PT verbalized understanding. She also stated she wants to hold off on the ENT referral because the mouth lesion seems to be getting smaller. I told her I would keep the referral in system and she can call me back if she wants to go.

## 2017-01-09 NOTE — Telephone Encounter (Signed)
-----   Message from Zachery Dauer, FNP sent at 01/08/2017  8:35 PM EDT ----- Within normal limits

## 2017-01-21 ENCOUNTER — Other Ambulatory Visit: Payer: Self-pay

## 2017-01-21 DIAGNOSIS — E039 Hypothyroidism, unspecified: Secondary | ICD-10-CM

## 2017-01-22 ENCOUNTER — Ambulatory Visit (INDEPENDENT_AMBULATORY_CARE_PROVIDER_SITE_OTHER): Payer: Self-pay | Admitting: General Surgery

## 2017-01-22 ENCOUNTER — Encounter: Payer: Self-pay | Admitting: General Surgery

## 2017-01-22 VITALS — BP 153/84 | HR 97 | Temp 98.4°F | Ht 63.0 in | Wt 209.6 lb

## 2017-01-22 DIAGNOSIS — L723 Sebaceous cyst: Secondary | ICD-10-CM

## 2017-01-22 LAB — T4: T4, Total: 11.2 ug/dL (ref 4.5–12.0)

## 2017-01-22 LAB — TSH: TSH: 0.152 u[IU]/mL — ABNORMAL LOW (ref 0.450–4.500)

## 2017-01-22 NOTE — Patient Instructions (Signed)
We will see you back in 2 weeks to make sure everything looks ok and remove your sutures. If you develop redness, drainage, or fever- please call our office immediately.

## 2017-01-22 NOTE — Progress Notes (Signed)
Patient ID: Linda [name], female   DOB: [birth year], 66 y.o.   MRN: [id number]  CC: Cyst  HPI Linda [name] is a 66 y.o. female who presents to clinic today for evaluation and hopeful treatment of a mid back sebaceous cyst. Patient reports she was seen at [facility] twice for this and had it lanced twice. She's never had a been removed. He continues to recur and causes discomfort which he leaned back against it. She says is currently not inflamed but does cause a pressure sensation. The pain as a pressure since some and always when she lays back against it. She denies any fevers, chills, nausea, vomiting, chest pain, shortness of breath, diarrhea, constipation. She is otherwise in her usual state of health and just desires to have this thing removed.  HPI  Past Medical History:  Diagnosis Date  . Goiter 07/18/2016    Past Surgical History:  Procedure Laterality Date  . CESAREAN SECTION    . DG GALL BLADDER    . TONSILECTOMY, ADENOIDECTOMY, BILATERAL MYRINGOTOMY AND TUBES  child    Family History  Problem Relation Age of Onset  . Heart disease Mother   . Alcoholism Father     Social History Social History  Substance Use Topics  . Smoking status: Former Games developer  . Smokeless tobacco: Never Used  . Alcohol use No    Allergies  Allergen Reactions  . Codeine Nausea And Vomiting  . Latex Rash  . Pollen Extract     Current Outpatient Prescriptions  Medication Sig Dispense Refill  . Cetirizine HCl 10 MG CAPS Take 1 capsule (10 mg total) by mouth daily. 30 capsule 3  . levothyroxine (SYNTHROID, LEVOTHROID) 100 MCG tablet Take 1 tablet (100 mcg total) by mouth daily before breakfast. 30 tablet 3   No current facility-administered medications for this visit.      Review of Systems A Multi-point review of systems was asked and was negative except for the findings documented in the history of present illness  Physical Exam Blood pressure (!) 153/84, pulse 97, temperature 98.4 F  (36.9 C), temperature source Oral, height  (1.6 m), weight 95.1 kg (209 lb 9.6 oz). CONSTITUTIONAL: No acute distress. EYES: Pupils are equal, round, and reactive to light, Sclera are non-icteric. EARS, NOSE, MOUTH AND THROAT: The oropharynx is clear. The oral mucosa is pink and moist. Hearing is intact to voice. LYMPH NODES:  Lymph nodes in the neck are normal. RESPIRATORY:  Lungs are clear. There is normal respiratory effort, with equal breath sounds bilaterally, and without pathologic use of accessory muscles. CARDIOVASCULAR: Heart is regular without murmurs, gallops, or rubs. GI: The abdomen is soft, nontender, and nondistended. There are no palpable masses. There is no hepatosplenomegaly. There are normal bowel sounds in all quadrants. GU: Rectal deferred.   MUSCULOSKELETAL: Normal muscle strength and tone. No cyanosis or edema.   SKIN: Obvious cyst with central pore just to the patient's left of midline of the mid back. Multiple tiny areas consistent with smaller sebaceous cyst across the back. They are nontender. No evidence of infection or inflammation. NEUROLOGIC: Motor and sensation is grossly normal. Cranial nerves are grossly intact. PSYCH:  Oriented to person, place and time. Affect is normal.  Data Reviewed No images and labs reviewed for this encounter I have personally reviewed the patient's imaging, laboratory findings and medical records.    Assessment    Sebaceous cyst    Plan    66 year old female with a sebaceous cyst.  Not currently inflamed or infected. Discussed with patient the procedure of a local excision in detail. She voiced understanding and desires to proceed. Informed consent was obtained for an office procedure of sebaceous cyst excision.  A time out was performed which correctly identified the patient and the procedure. The area was prepped and draped in the standard sterile fashion. A planned elliptical incision was marked and localized with 1%  lidocaine with epinephrine solution. This area was confirmed to be anesthetized elliptical incision was made with a 15 blade scalpel. Using sharp dissection the entire area was dissected out circumferentially. The cyst was never entered into and measured. The cyst was passed off the field in its entirety. The area was re-localized the aforementioned local anesthetic. Hemostasis was acquired using directed electrocautery. The incision site was closed with a 3-0 nylon suture in a vertical mattress fashion. A sterile dressing of plain gauze and tape was placed over this. Patient tolerated procedure well. There are no immediate complications.  Patient provided with discharge instructions. She is to keep the dressing over the area until there is no further drainage. She is to return to clinic medially should there be any signs of infection. Otherwise she'll follow-up in clinic in 2 weeks for a wound check and suture removal.     Time spent with the patient was 30 minutes, with more than 50% of the time spent in face-to-face education, counseling and care coordination.     Ricarda Frame, MD FACS General Surgeon 01/22/2017, 3:14 PM

## 2017-01-29 ENCOUNTER — Telehealth: Payer: Self-pay

## 2017-01-29 NOTE — Telephone Encounter (Signed)
Contacted patient with results of pathology on sebaceous cyst. Was unable to reach patient at this time. Left a message for to call back for results.

## 2017-02-05 ENCOUNTER — Telehealth: Payer: Self-pay

## 2017-02-05 ENCOUNTER — Ambulatory Visit: Payer: Self-pay

## 2017-02-05 NOTE — Telephone Encounter (Signed)
Called patient at this time to attempt to reschedule her nursing visit to get her sutures removed. Left message for her to call back to schedule this visit.

## 2017-02-10 NOTE — Telephone Encounter (Signed)
Left message for patient to call and let us know if she wants to come in and get her sutures removed seeing that she missed her appointment last week to get them removed. I asked if she could call our office back even if she had them removed somewhere else.

## 2017-03-17 ENCOUNTER — Other Ambulatory Visit: Payer: Self-pay

## 2017-03-17 DIAGNOSIS — E0590 Thyrotoxicosis, unspecified without thyrotoxic crisis or storm: Secondary | ICD-10-CM

## 2017-03-17 DIAGNOSIS — Z889 Allergy status to unspecified drugs, medicaments and biological substances status: Secondary | ICD-10-CM

## 2017-03-17 MED ORDER — CETIRIZINE HCL 10 MG PO CAPS: 10.0000 mg | ORAL_CAPSULE | Freq: Every day | ORAL | 3 refills | Status: DC

## 2017-03-17 NOTE — Telephone Encounter (Signed)
rx sent to pharmacy

## 2017-04-21 ENCOUNTER — Telehealth: Payer: Self-pay | Admitting: Pharmacy Technician

## 2017-04-21 NOTE — Telephone Encounter (Signed)
Patient eligible to receive medication assistance at [facility] until 12/11/17 as long as eligibility requirements continue to be met.  Mistina Coatney J. Royston Bekele Care Manager [facility] 

## 2017-05-07 ENCOUNTER — Other Ambulatory Visit: Payer: Self-pay | Admitting: Internal Medicine

## 2017-05-07 ENCOUNTER — Other Ambulatory Visit: Payer: Self-pay

## 2017-05-07 DIAGNOSIS — E0590 Thyrotoxicosis, unspecified without thyrotoxic crisis or storm: Secondary | ICD-10-CM

## 2017-05-07 MED ORDER — LEVOTHYROXINE SODIUM 100 MCG PO TABS: 100.0000 ug | ORAL_TABLET | Freq: Every day | ORAL | 3 refills | Status: DC

## 2017-07-13 ENCOUNTER — Telehealth: Payer: Self-pay

## 2017-07-13 DIAGNOSIS — Z889 Allergy status to unspecified drugs, medicaments and biological substances status: Secondary | ICD-10-CM

## 2017-07-13 MED ORDER — CETIRIZINE HCL 10 MG PO CAPS: 10.0000 mg | ORAL_CAPSULE | Freq: Every day | ORAL | 0 refills | Status: DC

## 2017-07-14 ENCOUNTER — Other Ambulatory Visit: Payer: Self-pay | Admitting: Urology

## 2017-07-14 DIAGNOSIS — Z889 Allergy status to unspecified drugs, medicaments and biological substances status: Secondary | ICD-10-CM

## 2017-07-30 ENCOUNTER — Other Ambulatory Visit: Payer: Self-pay

## 2017-07-30 DIAGNOSIS — Z889 Allergy status to unspecified drugs, medicaments and biological substances status: Secondary | ICD-10-CM

## 2017-07-30 MED ORDER — CETIRIZINE HCL 10 MG PO CAPS: 10.0000 mg | ORAL_CAPSULE | Freq: Every day | ORAL | 0 refills | Status: DC

## 2017-07-31 ENCOUNTER — Other Ambulatory Visit: Payer: Self-pay | Admitting: Internal Medicine

## 2017-07-31 DIAGNOSIS — E0590 Thyrotoxicosis, unspecified without thyrotoxic crisis or storm: Secondary | ICD-10-CM

## 2017-08-05 ENCOUNTER — Ambulatory Visit: Payer: Self-pay | Admitting: Internal Medicine

## 2017-08-05 ENCOUNTER — Encounter: Payer: Self-pay | Admitting: Internal Medicine

## 2017-08-05 VITALS — BP 149/93 | HR 89 | Temp 97.5°F | Wt 214.4 lb

## 2017-08-05 DIAGNOSIS — Z889 Allergy status to unspecified drugs, medicaments and biological substances status: Secondary | ICD-10-CM

## 2017-08-05 DIAGNOSIS — E0590 Thyrotoxicosis, unspecified without thyrotoxic crisis or storm: Secondary | ICD-10-CM

## 2017-08-05 MED ORDER — CETIRIZINE HCL 10 MG PO CAPS: 10.0000 mg | ORAL_CAPSULE | Freq: Every day | ORAL | 3 refills | Status: DC

## 2017-08-05 NOTE — Progress Notes (Signed)
   Subjective:    Patient ID: Linda PartridgePatricia Ann [name], female    DOB: 02/18/1951, 66 y.o.   MRN: [id number]  HPI  Pt has been doing well and taking her medication. 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 She is considering [facility] to see if she can get some medication to relieve the shoulder pain.       Allergies as of 08/05/2017      Reactions   Codeine Nausea And Vomiting   Latex Rash   Pollen Extract       Medication List       Accurate as of 08/05/17  9:27 AM. Always use your most recent med list.          Cetirizine HCl 10 MG Caps Take 1 capsule (10 mg total) by mouth daily.   levothyroxine 100 MCG tablet Commonly known as:  SYNTHROID, LEVOTHROID TAKE ONE TABLET BY MOUTH EVERY MORNING BEFORE BREAKFAST      Patient Active Problem List   Diagnosis Date Noted  . Sebaceous cyst 01/22/2017  . Goiter 07/18/2016     Review of Systems     Objective:   Physical Exam  Constitutional: She is oriented to person, place, and time.  Cardiovascular: Normal rate, regular rhythm and normal heart sounds.   Pulmonary/Chest: Effort normal and breath sounds normal.  Neurological: She is alert and oriented to person, place, and time.    BP (!) 149/93   Pulse 89   Temp (!) 97.5 F (36.4 C)   Wt 214 lb 6.4 oz (97.3 kg)   BMI 37.98 kg/m   BP taken manually in room: 124/88 (sitting)      Assessment & Plan:   Refill Cetirizine HCl tablet (90 capsules, 3 refills)  Labs ordered today; TSH, T4, CMET, CBC, UA, & Lipid panel  Follow up in 6 months with labs a week prior

## 2017-08-06 LAB — COMPREHENSIVE METABOLIC PANEL
A/G RATIO: 1.4 (ref 1.2–2.2)
ALBUMIN: 3.8 g/dL (ref 3.6–4.8)
ALT: 25 IU/L (ref 0–32)
AST: 16 IU/L (ref 0–40)
Alkaline Phosphatase: 109 IU/L (ref 39–117)
BILIRUBIN TOTAL: 0.4 mg/dL (ref 0.0–1.2)
BUN / CREAT RATIO: 6 — AB (ref 12–28)
BUN: 7 mg/dL — ABNORMAL LOW (ref 8–27)
CALCIUM: 9.1 mg/dL (ref 8.7–10.3)
CHLORIDE: 104 mmol/L (ref 96–106)
CO2: 25 mmol/L (ref 20–29)
Creatinine, Ser: 1.08 mg/dL — ABNORMAL HIGH (ref 0.57–1.00)
GFR, EST AFRICAN AMERICAN: 63 mL/min/{1.73_m2} (ref >59)
GFR, EST NON AFRICAN AMERICAN: 54 mL/min/{1.73_m2} — AB (ref >59)
GLOBULIN, TOTAL: 2.8 g/dL (ref 1.5–4.5)
Glucose: 98 mg/dL (ref 65–99)
POTASSIUM: 4.4 mmol/L (ref 3.5–5.2)
SODIUM: 141 mmol/L (ref 134–144)
TOTAL PROTEIN: 6.6 g/dL (ref 6.0–8.5)

## 2017-08-06 LAB — TSH: TSH: 0.667 u[IU]/mL (ref 0.450–4.500)

## 2017-08-06 LAB — URINALYSIS
BILIRUBIN UA: NEGATIVE
GLUCOSE, UA: NEGATIVE
Ketones, UA: NEGATIVE
LEUKOCYTES UA: NEGATIVE
Nitrite, UA: NEGATIVE
PH UA: 5.5 (ref 5.0–7.5)
PROTEIN UA: NEGATIVE
RBC UA: NEGATIVE
SPEC GRAV UA: 1.022 (ref 1.005–1.030)
Urobilinogen, Ur: 0.2 mg/dL (ref 0.2–1.0)

## 2017-08-06 LAB — LIPID PANEL
Chol/HDL Ratio: 5.1 ratio — ABNORMAL HIGH (ref 0.0–4.4)
Cholesterol, Total: 220 mg/dL — ABNORMAL HIGH (ref 100–199)
HDL: 43 mg/dL (ref >39)
LDL CALC: 146 mg/dL — AB (ref 0–99)
Triglycerides: 156 mg/dL — ABNORMAL HIGH (ref 0–149)
VLDL CHOLESTEROL CAL: 31 mg/dL (ref 5–40)

## 2017-08-06 LAB — CBC
HEMOGLOBIN: 14.9 g/dL (ref 11.1–15.9)
Hematocrit: 43.9 % (ref 34.0–46.6)
MCH: 29.7 pg (ref 26.6–33.0)
MCHC: 33.9 g/dL (ref 31.5–35.7)
MCV: 88 fL (ref 79–97)
Platelets: 278 10*3/uL (ref 150–379)
RBC: 5.01 x10E6/uL (ref 3.77–5.28)
RDW: 14 % (ref 12.3–15.4)
WBC: 7.9 10*3/uL (ref 3.4–10.8)

## 2017-08-06 LAB — T4: T4 TOTAL: 8.4 ug/dL (ref 4.5–12.0)

## 2017-08-20 NOTE — Telephone Encounter (Signed)
A user error has taken place.

## 2017-10-20 ENCOUNTER — Other Ambulatory Visit: Payer: Self-pay | Admitting: Internal Medicine

## 2017-10-20 DIAGNOSIS — E0590 Thyrotoxicosis, unspecified without thyrotoxic crisis or storm: Secondary | ICD-10-CM

## 2017-12-29 ENCOUNTER — Telehealth: Payer: Self-pay

## 2017-12-29 NOTE — Telephone Encounter (Signed)
Faxed denial for cetirizine for [facility]. Pt has not been seen for over a year

## 2018-01-06 ENCOUNTER — Other Ambulatory Visit: Payer: Self-pay | Admitting: Urology

## 2018-01-06 DIAGNOSIS — Z889 Allergy status to unspecified drugs, medicaments and biological substances status: Secondary | ICD-10-CM

## 2018-01-08 ENCOUNTER — Other Ambulatory Visit: Payer: Self-pay

## 2018-01-08 ENCOUNTER — Telehealth: Payer: Self-pay

## 2018-01-08 DIAGNOSIS — Z889 Allergy status to unspecified drugs, medicaments and biological substances status: Secondary | ICD-10-CM

## 2018-01-08 MED ORDER — CETIRIZINE HCL 10 MG PO TABS: 10.0000 mg | ORAL_TABLET | Freq: Every day | ORAL | 0 refills | Status: DC

## 2018-01-08 NOTE — Telephone Encounter (Signed)
Pt out of cetirizine. Sent in 30 day supply to pharmacy until pt could be seen.

## 2018-01-12 ENCOUNTER — Telehealth: Payer: Self-pay | Admitting: Pharmacy Technician

## 2018-01-12 NOTE — Telephone Encounter (Signed)
Received updated proof of income.  Patient eligible to receive medication assistance at [facility] through 2019, as long as eligibility requirements continue to be met.  [facility]

## 2018-01-27 ENCOUNTER — Other Ambulatory Visit: Payer: Self-pay

## 2018-01-27 ENCOUNTER — Telehealth: Payer: Self-pay

## 2018-01-27 DIAGNOSIS — E0590 Thyrotoxicosis, unspecified without thyrotoxic crisis or storm: Secondary | ICD-10-CM

## 2018-01-27 DIAGNOSIS — Z0000 Encounter for general adult medical examination without abnormal findings: Secondary | ICD-10-CM

## 2018-01-27 DIAGNOSIS — Z889 Allergy status to unspecified drugs, medicaments and biological substances status: Secondary | ICD-10-CM

## 2018-01-27 MED ORDER — CETIRIZINE HCL 10 MG PO TABS
10.0000 mg | ORAL_TABLET | Freq: Every day | ORAL | 0 refills | Status: DC
Start: 2018-01-27 — End: 2018-02-03

## 2018-01-27 MED ORDER — LEVOTHYROXINE SODIUM 100 MCG PO TABS: 100.0000 ug | ORAL_TABLET | Freq: Every day | ORAL | 0 refills | Status: DC

## 2018-01-27 NOTE — Telephone Encounter (Signed)
Pt requested a refill on cetirizine and levothyroxine until she could be seen next week. [facility] to [facility]

## 2018-01-28 LAB — CBC
HEMOGLOBIN: 15.7 g/dL (ref 11.1–15.9)
Hematocrit: 47 % — ABNORMAL HIGH (ref 34.0–46.6)
MCH: 30.4 pg (ref 26.6–33.0)
MCHC: 33.4 g/dL (ref 31.5–35.7)
MCV: 91 fL (ref 79–97)
Platelets: 318 10*3/uL (ref 150–379)
RBC: 5.16 x10E6/uL (ref 3.77–5.28)
RDW: 13.4 % (ref 12.3–15.4)
WBC: 8.9 10*3/uL (ref 3.4–10.8)

## 2018-01-28 LAB — COMPREHENSIVE METABOLIC PANEL
A/G RATIO: 1.4 (ref 1.2–2.2)
ALBUMIN: 4.2 g/dL (ref 3.6–4.8)
ALT: 16 IU/L (ref 0–32)
AST: 15 IU/L (ref 0–40)
Alkaline Phosphatase: 127 IU/L — ABNORMAL HIGH (ref 39–117)
BILIRUBIN TOTAL: 0.4 mg/dL (ref 0.0–1.2)
BUN / CREAT RATIO: 13 (ref 12–28)
BUN: 15 mg/dL (ref 8–27)
CO2: 23 mmol/L (ref 20–29)
Calcium: 9.1 mg/dL (ref 8.7–10.3)
Chloride: 103 mmol/L (ref 96–106)
Creatinine, Ser: 1.16 mg/dL — ABNORMAL HIGH (ref 0.57–1.00)
GFR calc non Af Amer: 50 mL/min/{1.73_m2} — ABNORMAL LOW (ref >59)
GFR, EST AFRICAN AMERICAN: 57 mL/min/{1.73_m2} — AB (ref >59)
GLOBULIN, TOTAL: 2.9 g/dL (ref 1.5–4.5)
Glucose: 93 mg/dL (ref 65–99)
POTASSIUM: 4.7 mmol/L (ref 3.5–5.2)
SODIUM: 143 mmol/L (ref 134–144)
TOTAL PROTEIN: 7.1 g/dL (ref 6.0–8.5)

## 2018-01-28 LAB — URINALYSIS
BILIRUBIN UA: NEGATIVE
GLUCOSE, UA: NEGATIVE
KETONES UA: NEGATIVE
Nitrite, UA: NEGATIVE
Protein, UA: NEGATIVE
RBC UA: NEGATIVE
SPEC GRAV UA: 1.026 (ref 1.005–1.030)
UUROB: 0.2 mg/dL (ref 0.2–1.0)
pH, UA: 5.5 (ref 5.0–7.5)

## 2018-01-28 LAB — LIPID PANEL
CHOL/HDL RATIO: 5 ratio — AB (ref 0.0–4.4)
Cholesterol, Total: 221 mg/dL — ABNORMAL HIGH (ref 100–199)
HDL: 44 mg/dL (ref >39)
LDL CALC: 150 mg/dL — AB (ref 0–99)
Triglycerides: 134 mg/dL (ref 0–149)
VLDL Cholesterol Cal: 27 mg/dL (ref 5–40)

## 2018-01-28 LAB — T4: T4, Total: 9.5 ug/dL (ref 4.5–12.0)

## 2018-01-28 LAB — TSH: TSH: 0.116 u[IU]/mL — ABNORMAL LOW (ref 0.450–4.500)

## 2018-02-03 ENCOUNTER — Encounter: Payer: Self-pay | Admitting: Internal Medicine

## 2018-02-03 ENCOUNTER — Ambulatory Visit: Payer: Self-pay | Admitting: Internal Medicine

## 2018-02-03 VITALS — BP 151/100 | HR 99 | Temp 98.2°F | Wt 212.7 lb

## 2018-02-03 DIAGNOSIS — E0590 Thyrotoxicosis, unspecified without thyrotoxic crisis or storm: Secondary | ICD-10-CM

## 2018-02-03 DIAGNOSIS — Z889 Allergy status to unspecified drugs, medicaments and biological substances status: Secondary | ICD-10-CM

## 2018-02-03 DIAGNOSIS — E039 Hypothyroidism, unspecified: Secondary | ICD-10-CM

## 2018-02-03 MED ORDER — LEVOTHYROXINE SODIUM 88 MCG PO TABS: 88.0000 ug | ORAL_TABLET | Freq: Every day | ORAL | 0 refills | Status: DC

## 2018-02-03 MED ORDER — CETIRIZINE HCL 10 MG PO CAPS: 10.0000 mg | ORAL_CAPSULE | Freq: Every day | ORAL | 3 refills | Status: AC

## 2018-02-03 NOTE — Progress Notes (Signed)
   Subjective:    Patient ID: Linda PartridgePatricia Ann [name], female    DOB: 04/08/1951, 67 y.o.   MRN: [id number]  HPI  Pt is here for a f/u on labs. No new complaints.    Allergies as of 02/03/2018      Reactions   Codeine Nausea And Vomiting   Latex Rash   Pollen Extract       Medication List        Accurate as of 02/03/18  9:40 AM. Always use your most recent med list.          Cetirizine HCl 10 MG Caps Take 1 capsule (10 mg total) by mouth daily.   cetirizine 10 MG tablet Commonly known as:  ZYRTEC Take 1 tablet (10 mg total) by mouth daily.   levothyroxine 100 MCG tablet Commonly known as:  SYNTHROID, LEVOTHROID Take 1 tablet (100 mcg total) by mouth daily with breakfast.      Patient Active Problem List   Diagnosis Date Noted  . Sebaceous cyst 01/22/2017  . Goiter 07/18/2016      Review of Systems     Objective:   Physical Exam  Constitutional: She is oriented to person, place, and time.  Cardiovascular: Normal rate, regular rhythm and normal heart sounds.  Pulmonary/Chest: Effort normal and breath sounds normal.  Neurological: She is alert and oriented to person, place, and time.    BP (!) 151/100   Pulse 99   Temp 98.2 F (36.8 C)   Wt 212 lb 11.2 oz (96.5 kg)   BMI 37.68 kg/m   BP taken manually in room: cant get reading because of pain caused by BP cuff      Assessment & Plan:   Lower dose of thyroid medication from 100 mcg to 88 mcg to see if that lowers pt's BP. Advised pt to avoid excess salt in her diet.   Schedule f/u appt on May 22nd (labs a week prior)

## 2018-02-24 ENCOUNTER — Other Ambulatory Visit: Payer: Self-pay

## 2018-03-03 ENCOUNTER — Ambulatory Visit: Payer: Self-pay | Admitting: Internal Medicine

## 2018-03-03 ENCOUNTER — Other Ambulatory Visit: Payer: Self-pay

## 2018-03-03 DIAGNOSIS — E039 Hypothyroidism, unspecified: Secondary | ICD-10-CM

## 2018-03-04 LAB — COMPREHENSIVE METABOLIC PANEL
ALT: 22 IU/L (ref 0–32)
AST: 16 IU/L (ref 0–40)
Albumin/Globulin Ratio: 1.4 (ref 1.2–2.2)
Albumin: 4 g/dL (ref 3.6–4.8)
Alkaline Phosphatase: 115 IU/L (ref 39–117)
BUN/Creatinine Ratio: 9 — ABNORMAL LOW (ref 12–28)
BUN: 9 mg/dL (ref 8–27)
Bilirubin Total: 0.3 mg/dL (ref 0.0–1.2)
CALCIUM: 9.1 mg/dL (ref 8.7–10.3)
CHLORIDE: 104 mmol/L (ref 96–106)
CO2: 22 mmol/L (ref 20–29)
Creatinine, Ser: 1.02 mg/dL — ABNORMAL HIGH (ref 0.57–1.00)
GFR, EST AFRICAN AMERICAN: 67 mL/min/{1.73_m2} (ref >59)
GFR, EST NON AFRICAN AMERICAN: 58 mL/min/{1.73_m2} — AB (ref >59)
Globulin, Total: 2.9 g/dL (ref 1.5–4.5)
Glucose: 98 mg/dL (ref 65–99)
POTASSIUM: 4.5 mmol/L (ref 3.5–5.2)
Sodium: 142 mmol/L (ref 134–144)
TOTAL PROTEIN: 6.9 g/dL (ref 6.0–8.5)

## 2018-03-04 LAB — TSH: TSH: 0.17 u[IU]/mL — AB (ref 0.450–4.500)

## 2018-03-04 LAB — T4: T4, Total: 9.3 ug/dL (ref 4.5–12.0)

## 2018-03-10 ENCOUNTER — Ambulatory Visit: Payer: Self-pay | Admitting: Internal Medicine

## 2018-03-10 ENCOUNTER — Encounter: Payer: Self-pay | Admitting: Internal Medicine

## 2018-03-10 DIAGNOSIS — E039 Hypothyroidism, unspecified: Secondary | ICD-10-CM

## 2018-03-10 NOTE — Progress Notes (Signed)
   Subjective:    Patient ID: Linda [name], female    DOB: Dec 24, 1950, 67 y.o.   MRN: [id number]  HPI   Pt is here for a f/u appointment. No new complaints.   Allergies as of 03/10/2018      Reactions   Codeine Nausea And Vomiting   Latex Rash   Pollen Extract       Medication List        Accurate as of 03/10/18 10:35 AM. Always use your most recent med list.          Cetirizine HCl 10 MG Caps Take 1 capsule (10 mg total) by mouth daily.   levothyroxine 88 MCG tablet Commonly known as:  SYNTHROID, LEVOTHROID Take 1 tablet (88 mcg total) by mouth daily with breakfast.      Patient Active Problem List   Diagnosis Date Noted  . Sebaceous cyst 01/22/2017  . Goiter 07/18/2016      Review of Systems     Objective:   Physical Exam  Constitutional: She is oriented to person, place, and time.  Cardiovascular: Normal rate, regular rhythm and normal heart sounds.  Pulmonary/Chest: Effort normal and breath sounds normal.  Neurological: She is alert and oriented to person, place, and time.   BP (!) 137/99 (BP Location: Left Arm, Patient Position: Sitting)   Pulse 90   Temp 97.8 F (36.6 C) (Oral)   Wt 211 lb 3.2 oz (95.8 kg)   BMI 37.41 kg/m        Assessment & Plan:   Advised pt to take allergy pill 3 times a week now to improve symptoms in the fall.  Take half of the levothyroxine pill ( ) per day. Pt doesn't need any refills at this time. Rtc in about 6 weeks.

## 2018-03-16 ENCOUNTER — Encounter: Payer: Self-pay | Admitting: Licensed Clinical Social Worker

## 2018-03-16 ENCOUNTER — Telehealth: Payer: Self-pay | Admitting: Licensed Clinical Social Worker

## 2018-03-16 NOTE — Telephone Encounter (Signed)
Clinician reached out to the patient based on response to a social determinants screening in clinic on Wednesday May 29th that she was worried about running out of food before the end of the month.  Ms. Linda [name] stated that it was a mistake and that she does not need assistance with food.

## 2018-04-14 ENCOUNTER — Other Ambulatory Visit: Payer: Self-pay

## 2018-04-14 DIAGNOSIS — E039 Hypothyroidism, unspecified: Secondary | ICD-10-CM

## 2018-04-15 LAB — T4: T4, Total: 5.6 ug/dL (ref 4.5–12.0)

## 2018-04-15 LAB — TSH: TSH: 13.9 u[IU]/mL — ABNORMAL HIGH (ref 0.450–4.500)

## 2018-04-21 ENCOUNTER — Encounter: Payer: Self-pay | Admitting: Internal Medicine

## 2018-04-21 ENCOUNTER — Ambulatory Visit: Payer: Self-pay | Admitting: Internal Medicine

## 2018-04-21 VITALS — BP 137/97 | HR 104 | Temp 98.2°F | Ht 64.75 in | Wt 214.2 lb

## 2018-04-21 DIAGNOSIS — E071 Dyshormogenetic goiter: Principal | ICD-10-CM

## 2018-04-21 DIAGNOSIS — E049 Nontoxic goiter, unspecified: Secondary | ICD-10-CM

## 2018-04-21 DIAGNOSIS — E039 Hypothyroidism, unspecified: Secondary | ICD-10-CM

## 2018-04-21 MED ORDER — LEVOTHYROXINE SODIUM 50 MCG PO TABS: 50.0000 ug | ORAL_TABLET | Freq: Every day | ORAL | 1 refills | Status: DC

## 2018-04-21 NOTE — Progress Notes (Signed)
   Subjective:    Patient ID: Linda PartridgePatricia Ann [name], female    DOB: 07/19/1951, 67 y.o.   MRN: [id number]  HPI   Pt return for medication FU for hypothyroidism with goiter.   Most recent labs indicate that TSH levels are not where desired.    Review of Systems Patient Active Problem List   Diagnosis Date Noted  . Sebaceous cyst 01/22/2017  . Goiter 07/18/2016   Allergies as of 04/21/2018      Reactions   Codeine Nausea And Vomiting   Latex Rash   Pollen Extract       Medication List        Accurate as of 04/21/18 11:40 AM. Always use your most recent med list.          Cetirizine HCl 10 MG Caps Take 1 capsule (10 mg total) by mouth daily.   levothyroxine 88 MCG tablet Commonly known as:  SYNTHROID, LEVOTHROID Take 1 tablet (88 mcg total) by mouth daily with breakfast.          Objective:   Physical Exam  Constitutional: She is oriented to person, place, and time.  Cardiovascular: Normal rate, regular rhythm and normal heart sounds.  Pulmonary/Chest: Effort normal and breath sounds normal.  Neurological: She is alert and oriented to person, place, and time.  Vitals reviewed.     Assessment & Plan:   Previously, pt's levothyroxine was decreased from 88mcg to 44mcg.  Her most recent labs show her TSH went up but her T4 remains therapeutic.   Increase Synthroid to 50 mcg for normalized TSH level.

## 2018-04-21 NOTE — Assessment & Plan Note (Signed)
D/C synthroid and start synthroid

## 2018-05-19 ENCOUNTER — Other Ambulatory Visit: Payer: Self-pay

## 2018-05-19 DIAGNOSIS — E049 Nontoxic goiter, unspecified: Secondary | ICD-10-CM

## 2018-05-19 DIAGNOSIS — E071 Dyshormogenetic goiter: Principal | ICD-10-CM

## 2018-05-20 LAB — T4: T4 TOTAL: 6 ug/dL (ref 4.5–12.0)

## 2018-05-20 LAB — TSH: TSH: 15.41 u[IU]/mL — ABNORMAL HIGH (ref 0.450–4.500)

## 2018-05-26 ENCOUNTER — Ambulatory Visit: Payer: Self-pay | Admitting: Internal Medicine

## 2018-05-26 ENCOUNTER — Encounter: Payer: Self-pay | Admitting: Internal Medicine

## 2018-05-26 VITALS — BP 139/97 | HR 89 | Temp 98.6°F | Ht 64.0 in | Wt 215.5 lb

## 2018-05-26 DIAGNOSIS — E049 Nontoxic goiter, unspecified: Secondary | ICD-10-CM

## 2018-05-26 DIAGNOSIS — E071 Dyshormogenetic goiter: Principal | ICD-10-CM

## 2018-05-26 MED ORDER — LEVOTHYROXINE SODIUM 50 MCG PO TABS: 75.0000 ug | ORAL_TABLET | Freq: Every day | ORAL | 1 refills | Status: DC

## 2018-05-26 NOTE — Progress Notes (Signed)
   Subjective:    Patient ID: Linda PartridgePatricia Ann [name], female    DOB: 03/29/1951, 67 y.o.   MRN: [id number]  HPI  Pt returns for follow up regarding hypothyroidism with goiter. Last TSH lab on 05/19/18 was 15.410.   BP is elevated and patient reported that she experiences pain when her BP is checked.   Pt reported pain in lower back; pt denied taking NSAIDs for pain relief, she needs something "stronger."    Review of Systems   Patient Active Problem List   Diagnosis Date Noted  . 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 Sebaceous cyst 01/22/2017  . Goiter 07/18/2016   Allergies as of 05/26/2018      Reactions   Codeine Nausea And Vomiting   Latex Rash   Pollen Extract       Medication List        Accurate as of 05/26/18 11:28 AM. Always use your most recent med list.          Cetirizine HCl 10 MG Caps Take 1 capsule (10 mg total) by mouth daily.   levothyroxine 50 MCG tablet Commonly known as:  SYNTHROID, LEVOTHROID Take 1 tablet (50 mcg total) by mouth daily before breakfast.           Objective:   Physical Exam  Constitutional: She is oriented to person, place, and time.  Cardiovascular: Normal rate, regular rhythm and normal heart sounds.  Pulmonary/Chest: Effort normal and breath sounds normal.  Neurological: She is alert and oriented to person, place, and time.    BP (!) 139/97   Pulse 89   Temp 98.6 F (37 C)   Ht 5\' 4"  (1.626 m)   Wt 215 lb 8 oz (97.8 kg)   BMI 36.99 kg/m       Assessment & Plan:   Increase levothyroxine from 50mcg to 75mcg (one and a half tablets) daily.   For elevated BP, patient advised to eliminate salty foods from her diet.   Patient advised to ice her lower back for pain management.

## 2018-07-14 ENCOUNTER — Other Ambulatory Visit: Payer: Self-pay

## 2018-07-14 DIAGNOSIS — E0590 Thyrotoxicosis, unspecified without thyrotoxic crisis or storm: Secondary | ICD-10-CM

## 2018-07-14 NOTE — Progress Notes (Unsigned)
tsh

## 2018-07-15 LAB — TSH: TSH: 2.92 u[IU]/mL (ref 0.450–4.500)

## 2018-07-21 ENCOUNTER — Encounter: Payer: Self-pay | Admitting: Internal Medicine

## 2018-07-21 ENCOUNTER — Other Ambulatory Visit: Payer: Self-pay

## 2018-07-21 ENCOUNTER — Ambulatory Visit: Payer: Self-pay | Admitting: Internal Medicine

## 2018-07-21 DIAGNOSIS — E049 Nontoxic goiter, unspecified: Secondary | ICD-10-CM

## 2018-07-21 DIAGNOSIS — E071 Dyshormogenetic goiter: Principal | ICD-10-CM

## 2018-07-21 DIAGNOSIS — E039 Hypothyroidism, unspecified: Secondary | ICD-10-CM

## 2018-07-21 MED ORDER — LEVOTHYROXINE SODIUM 75 MCG PO TABS: 75.0000 ug | ORAL_TABLET | Freq: Every day | ORAL | 3 refills | Status: AC

## 2018-07-21 NOTE — Progress Notes (Signed)
   Subjective:    Patient ID: Linda [name], female    DOB: [birth year], 67 y.o.   MRN: [id number]  HPI   Pt presents for follow up on hypothyroidism and hypertension.   Pt did not report any medical complaints.   Pt turned 67 today.   Chief Complaint  Patient presents with  . Hypothyroidism    followup on labs    Review of Systems Patient Active Problem List   Diagnosis Date Noted  . 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 Sebaceous cyst 01/22/2017  . Goiter 07/18/2016   Allergies as of 07/21/2018      Reactions   Codeine Nausea And Vomiting   Latex Rash   Pollen Extract       Medication List        Accurate as of 07/21/18 11:38 AM. Always use your most recent med list.          Cetirizine HCl 10 MG Caps Take 1 capsule (10 mg total) by mouth daily.   levothyroxine 50 MCG tablet Commonly known as:  SYNTHROID, LEVOTHROID Take 1.5 tablets (75 mcg total) by mouth daily before breakfast.         Objective:   Physical Exam  BP 128/78 (BP Location: Left Wrist, Patient Position: Sitting)   Wt 213 lb (96.6 kg)   BMI 36.56 kg/m      Assessment & Plan:   Hypothyroidism: Controlled with Synthroid 75 mcg daily. Changed prescription from 50 mcg, 1.5 tablets tablets daily to 75 mcg, 1 tablet daily. Dose did not change.   Hypertension: Controlled with diet changes.  Pt has reached Medicare age and is being transferred to private care.

## 2018-09-21 ENCOUNTER — Telehealth: Payer: Self-pay | Admitting: Pharmacy Technician

## 2018-09-21 NOTE — Telephone Encounter (Signed)
Pt has Medicare Part A.  Pt instructed to be screened for a Medicare Savings Plan and Low-Income Subsidy (L.I.S).  [facility] no longer able to provide medication assistance, because patient is eligible to sign-up for a Part D plan.  Patient notified.  Sherilyn DacostaBetty J. Dorothe Elmore Care Manager [facility]

## 2020-10-04 ENCOUNTER — Emergency Department
Admission: EM | Admit: 2020-10-04 | Discharge: 2020-10-05 | Disposition: A | Discharge status: Home / Self Care | Payer: Medicaid Other | Attending: Emergency Medicine | Admitting: Emergency Medicine

## 2020-10-04 ENCOUNTER — Other Ambulatory Visit: Payer: Self-pay

## 2020-10-04 ENCOUNTER — Encounter: Payer: Self-pay | Admitting: Emergency Medicine

## 2020-10-04 DIAGNOSIS — E039 Hypothyroidism, unspecified: Secondary | ICD-10-CM | POA: Insufficient documentation

## 2020-10-04 DIAGNOSIS — Z79899 Other long term (current) drug therapy: Secondary | ICD-10-CM | POA: Diagnosis not present

## 2020-10-04 DIAGNOSIS — R0989 Other specified symptoms and signs involving the circulatory and respiratory systems: Secondary | ICD-10-CM | POA: Diagnosis not present

## 2020-10-04 DIAGNOSIS — T189XXA Foreign body of alimentary tract, part unspecified, initial encounter: Secondary | ICD-10-CM | POA: Insufficient documentation

## 2020-10-04 DIAGNOSIS — Z5321 Procedure and treatment not carried out due to patient leaving prior to being seen by health care provider: Secondary | ICD-10-CM | POA: Diagnosis not present

## 2020-10-04 DIAGNOSIS — X58XXXA Exposure to other specified factors, initial encounter: Secondary | ICD-10-CM | POA: Diagnosis not present

## 2020-10-04 DIAGNOSIS — Z87891 Personal history of nicotine dependence: Secondary | ICD-10-CM | POA: Insufficient documentation

## 2020-10-04 DIAGNOSIS — Z91040 Latex allergy status: Secondary | ICD-10-CM | POA: Diagnosis not present

## 2020-10-04 NOTE — ED Notes (Addendum)
Pt in triage room drinking coke and eating saltine crackers. No distress noted, pt able to keep food and liquids down without difficulty.

## 2020-10-04 NOTE — ED Notes (Signed)
Pt told passing RN that she wanted to leave that she no longer felt pill was in her throat. Pt not visualized nor answered when called to be roomed.

## 2020-10-04 NOTE — ED Triage Notes (Signed)
Pt arrived via POV with reports of an Allegra tablet stuck in throat since 11am this morning. Pt has been able to keep liquids down since then and feels like it is stuck in her throat. Pt has not eaten anything stating she was afraid of choking to death because the pill would not go down.  Pt not having any respiratory distress, pt able to drink water without any difficulty.

## 2020-10-04 NOTE — ED Notes (Signed)
Pt reports improvement in symptoms since drinking the coke and eating the crackers.

## 2020-10-04 NOTE — ED Triage Notes (Signed)
Pt called from [facility] to treatment room, no response 

## 2020-10-04 NOTE — ED Notes (Signed)
D/w Dr. Erma Heritage and Dr. Scotty Court, give patient crackers and soda to help with symptoms. No additional orders at this time.

## 2020-10-05 ENCOUNTER — Emergency Department
Admission: EM | Admit: 2020-10-05 | Discharge: 2020-10-05 | Disposition: A | Discharge status: Home / Self Care | Payer: Medicaid Other | Source: Home / Self Care | Attending: Emergency Medicine | Admitting: Emergency Medicine

## 2020-10-05 ENCOUNTER — Emergency Department: Payer: Medicaid Other

## 2020-10-05 ENCOUNTER — Encounter: Payer: Self-pay | Admitting: Emergency Medicine

## 2020-10-05 DIAGNOSIS — E039 Hypothyroidism, unspecified: Secondary | ICD-10-CM | POA: Insufficient documentation

## 2020-10-05 DIAGNOSIS — R0989 Other specified symptoms and signs involving the circulatory and respiratory systems: Secondary | ICD-10-CM | POA: Insufficient documentation

## 2020-10-05 DIAGNOSIS — Z91040 Latex allergy status: Secondary | ICD-10-CM | POA: Insufficient documentation

## 2020-10-05 DIAGNOSIS — Z87891 Personal history of nicotine dependence: Secondary | ICD-10-CM | POA: Insufficient documentation

## 2020-10-05 DIAGNOSIS — Z79899 Other long term (current) drug therapy: Secondary | ICD-10-CM | POA: Insufficient documentation

## 2020-10-05 MED ORDER — DIPHENHYDRAMINE HCL 12.5 MG/5ML PO ELIX
12.5000 mg | ORAL_SOLUTION | Freq: Once | ORAL | Status: AC
  Administered 2020-10-05: 12.5 mg via ORAL
  Filled 2020-10-05: qty 5

## 2020-10-05 MED ORDER — LIDOCAINE VISCOUS HCL 2 % MT SOLN: 15.0000 mL | Freq: Once | OROMUCOSAL | Status: DC

## 2020-10-05 MED ORDER — LIDOCAINE VISCOUS HCL 2 % MT SOLN: 5.0000 mL | Freq: Four times a day (QID) | OROMUCOSAL | 0 refills | Status: AC | PRN

## 2020-10-05 MED ORDER — DIPHENHYDRAMINE HCL 12.5 MG/5ML PO SYRP: 6.2500 mg | ORAL_SOLUTION | Freq: Four times a day (QID) | ORAL | 0 refills | Status: AC | PRN

## 2020-10-05 MED ORDER — LIDOCAINE VISCOUS HCL 2 % MT SOLN
5.0000 mL | Freq: Once | OROMUCOSAL | Status: AC
  Administered 2020-10-05: 5 mL via OROMUCOSAL
  Filled 2020-10-05: qty 15

## 2020-10-05 NOTE — ED Provider Notes (Signed)
[facility] Emergency Department Provider Note   ____________________________________________   Event Date/Time   First MD Initiated Contact with Patient 10/05/20 [id number]     (approximate)  I have reviewed the triage vital signs and the nursing notes.   HISTORY  Chief Complaint Airway Obstruction (Pill) and Throat irritation    HPI Linda [name] is a 69 y.o. female patient presents with foreign body sensation right side of her throat. Patient states she took an allergy pill yesterday morning around 11 PM felt to get stuck in her throat. Patient came to the emergency room about 2:00 in the morning and was seen in triage. During that initial triage patient was able to eat and drink a soda and crackers. Patient elected to go home without seeing a doctor. Patient states she cannot sleep secondary to continue foreign body sensation. Patient denies pain just irritation. Patient is convinced that the tablet is still stuck in her throat.      Past Medical History:  Diagnosis Date  . Goiter 07/18/2016    Patient Active Problem List   Diagnosis Date Noted  . 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 Sebaceous cyst 01/22/2017  . Goiter 07/18/2016    Past Surgical History:  Procedure Laterality Date  . CESAREAN SECTION    . DG GALL BLADDER    . TONSILECTOMY, ADENOIDECTOMY, BILATERAL MYRINGOTOMY AND TUBES  child    Prior to Admission medications   Medication Sig Start Date End Date Taking? Authorizing Provider  Cetirizine HCl 10 MG CAPS Take 1 capsule (10 mg total) by mouth daily. 02/03/18   Virl Axe, MD  levothyroxine (SYNTHROID, LEVOTHROID) 75 MCG tablet Take 1 tablet (75 mcg total) by mouth daily before breakfast. 07/21/18   Virl Axe, MD    Allergies Codeine, Latex, and Pollen extract  Family History  Problem Relation Age of Onset  . Heart disease Mother   . Alcoholism Father     Social History Social History   Tobacco Use  .  Smoking status: Former Games developer  . Smokeless tobacco: Never Used  Vaping Use  . Vaping Use: Never used  Substance Use Topics  . Alcohol use: No  . Drug use: No    Review of Systems Constitutional: No fever/chills Eyes: No visual changes. ENT: No sore throat.  Esophageal foreign body sensation.   Cardiovascular: Denies chest pain. Respiratory: Denies shortness of breath. Gastrointestinal: No abdominal pain.  No nausea, no vomiting.  No diarrhea.  No constipation. Genitourinary: Negative for dysuria. Musculoskeletal: Negative for back pain. Skin: Negative for rash. Neurological: Negative for headaches, focal weakness or numbness. Endocrine:  Hypothyroidism Allergic/Immunilogical: Codeine, latex, and pollen extracts. ____________________________________________   PHYSICAL EXAM:  VITAL SIGNS: ED Triage Vitals  Enc Vitals Group     BP 10/05/20 0220 (!) 148/111     Pulse Rate 10/05/20 0220 (!) 108     Resp 10/05/20 0220 (!) 22     Temp 10/05/20 0220 97.7 F (36.5 C)     Temp Source 10/05/20 0220 Oral     SpO2 10/05/20 0220 96 %     Weight 10/05/20 0221 215 lb (97.5 kg)     Height 10/05/20 0221 5\' 6"  (1.676 m)     Head Circumference --      Peak Flow --      Pain Score 10/05/20 0221 0     Pain Loc --      Pain Edu? --      Excl. in  GC? --     Constitutional: Alert and oriented. Well appearing and in no acute distress.  Anxious. Eyes: Conjunctivae are normal. PERRL. EOMI. Head: Atraumatic. Nose: No congestion/rhinnorhea. Mouth/Throat: Mucous membranes are moist.  Oropharynx non-erythematous. Neck: No stridor.  Hematological/Lymphatic/Immunilogical: No cervical lymphadenopathy. Cardiovascular: Tachycardic, regular rhythm. Grossly normal heart sounds.  Good peripheral circulation.  Elevated blood pressure  Respiratory: Normal respiratory effort.  No retractions. Lungs CTAB. Gastrointestinal: Soft and nontender. No distention. No abdominal bruits. No CVA  tenderness. Genitourinary: Deferred Neurologic:  Normal speech and language. No gross focal neurologic deficits are appreciated. No gait instability. Skin:  Skin is warm, dry and intact. No rash noted. Psychiatric: Mood and affect are normal. Speech and behavior are normal.  ____________________________________________   LABS (all labs ordered are listed, but only abnormal results are displayed)  Labs Reviewed - No data to display ____________________________________________  EKG   ____________________________________________  RADIOLOGY I, Joni Reining, personally viewed and evaluated these images (plain radiographs) as part of my medical decision making, as well as reviewing the written report by the radiologist.  ED MD interpretation: No acute findings on soft tissue neck x-ray.  Official radiology report(s): No results found.  ____________________________________________   PROCEDURES  Procedure(s) performed (including Critical Care):  Procedures   ____________________________________________   INITIAL IMPRESSION / ASSESSMENT AND PLAN / ED COURSE  As part of my medical decision making, I reviewed the following data within the electronic MEDICAL RECORD NUMBER         Presents for esophageal foreign body sensation greater than 18 hours.  Patient suspects she has a pill dislodge in her throat.  Patient is able to tolerate food and fluids.  Discussed no foreign body or edema or soft tissue neck x-ray.  Discussed sequela of esophageal irritation.  Patient given a prescription for viscous lidocaine to mix with Benadryl elixir for swish and swallow.  Patient advised follow-up PCP if there is no improvement in 3 days.  Return to ED if condition worsens.     ____________________________________________   FINAL CLINICAL IMPRESSION(S) / ED DIAGNOSES  Final diagnoses:  None     ED Discharge Orders    None      *Please note:  Linda [name] was evaluated in  Emergency Department on 10/05/2020 for the symptoms described in the history of present illness. She was evaluated in the context of the global COVID-19 pandemic, which necessitated consideration that the patient might be at risk for infection with the SARS-CoV-2 virus that causes COVID-19. Institutional protocols and algorithms that pertain to the evaluation of patients at risk for COVID-19 are in a state of rapid change based on information released by regulatory bodies including the CDC and federal and state organizations. These policies and algorithms were followed during the patient's care in the ED.  Some ED evaluations and interventions may be delayed as a result of limited staffing during and the pandemic.*   Note:  This document was prepared using Dragon voice recognition software and may include unintentional dictation errors.    Joni Reining, PA-C 10/05/20 [id number]    Sharyn Creamer, MD 10/05/20 [id number]

## 2020-10-05 NOTE — ED Triage Notes (Addendum)
  Patient comes in stating she has a pill stuck in her throat.  Patient took allegra tablet yesterday morning around 1100 and felt it get stuck, came to ED and was evaluated.  Was able to drink/eat and was discharged.  Patient states she was at home and feels like the tablet moved and is causing her anxiety.  Patient tried several things she read online and did not help.  Patient also tried to purge but no relief.  No pain in throat, just irritated.

## 2020-10-05 NOTE — ED Notes (Addendum)
NAD noted at time of D/C. Pt denies questions or concerns. Pt ambulatory to the lobby at this time. Verbal consent for D/C obtained at this time.  

## 2020-10-05 NOTE — Discharge Instructions (Addendum)
No foreign body or soft tissue swelling noted on x-ray of your neck.  Take medication as directed.  Follow-up PCP if there is no improvement in 3 days.  Return to ED if condition worsens.  Command 3-day blood pressure check secondary to elevated readings of 165/105.

## 2020-10-05 NOTE — ED Notes (Signed)
Pt states initially seen yesterday for pill caught in the R side of her throat, able to drink liquids and eat crackers and felt like pill passed, got home yesterday and feels like something is caught in the middle of her lower throat. Pt states came back due to sensation of choking. Pt states has been able to swallow liquids without difficulty. Pt able to speak in full and complete sentences without difficulty. Pt A&O x4.

## 2022-10-29 IMAGING — CR DG NECK SOFT TISSUE
2 series · 2 of 2 positions shown · non-contrast
Comparison: None.

CLINICAL DATA: Foreign body sensation in throat.

EXAM:
NECK SOFT TISSUES - 1+ VIEW

[neck lat]
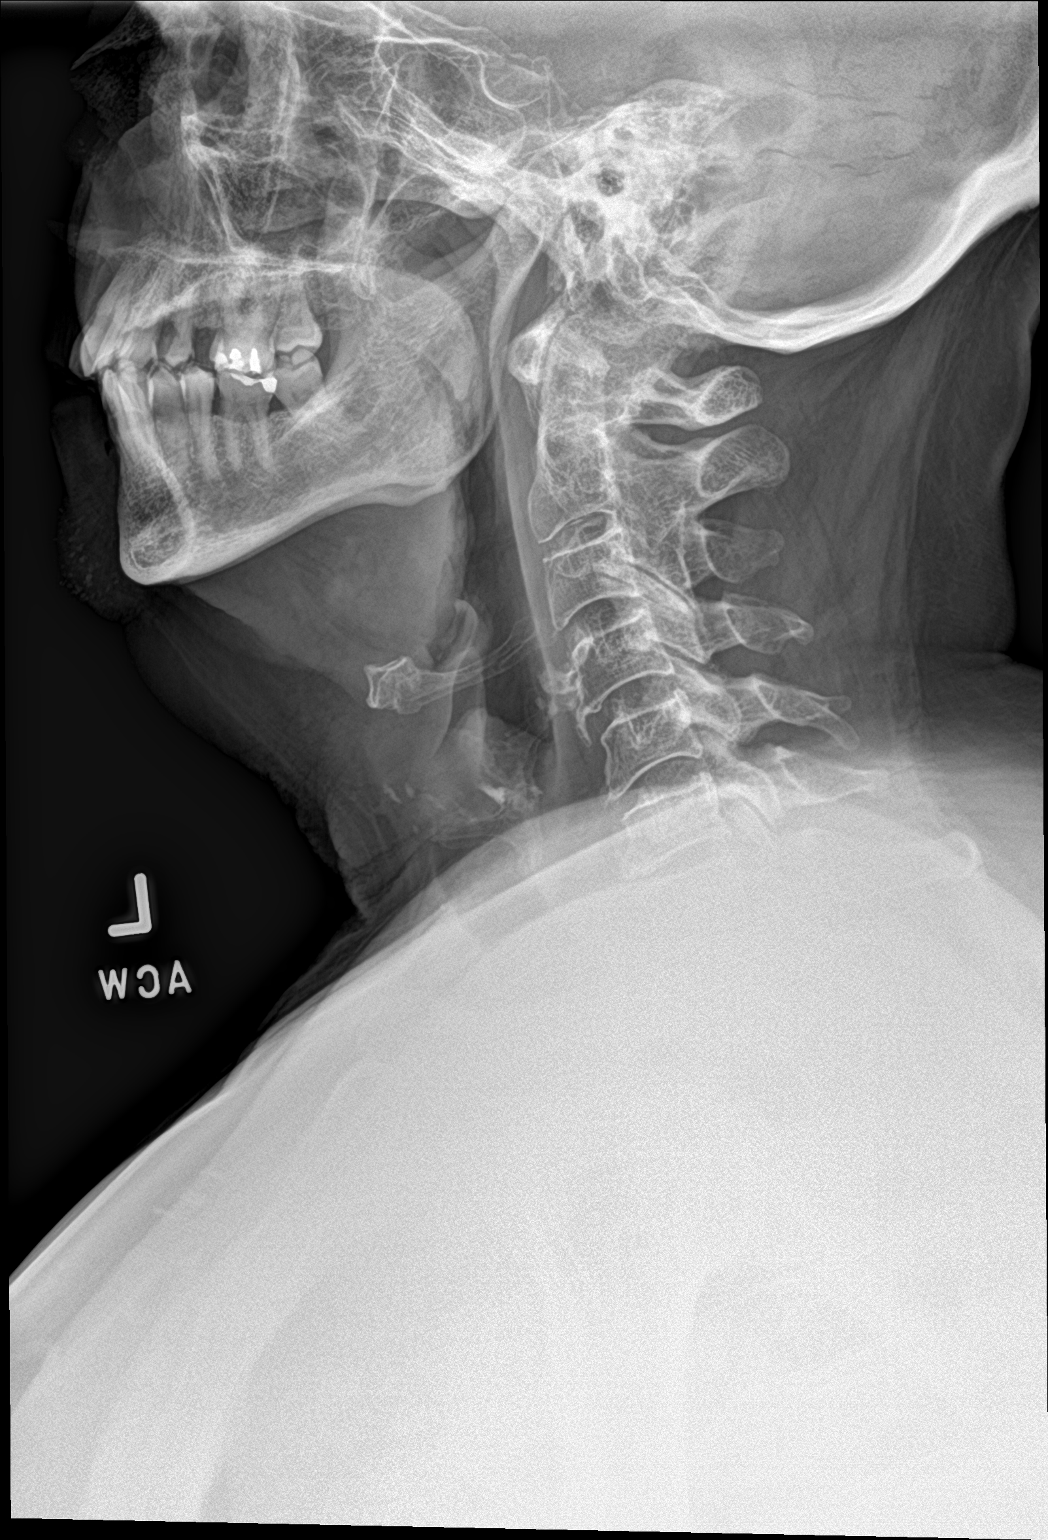

[neck ap]
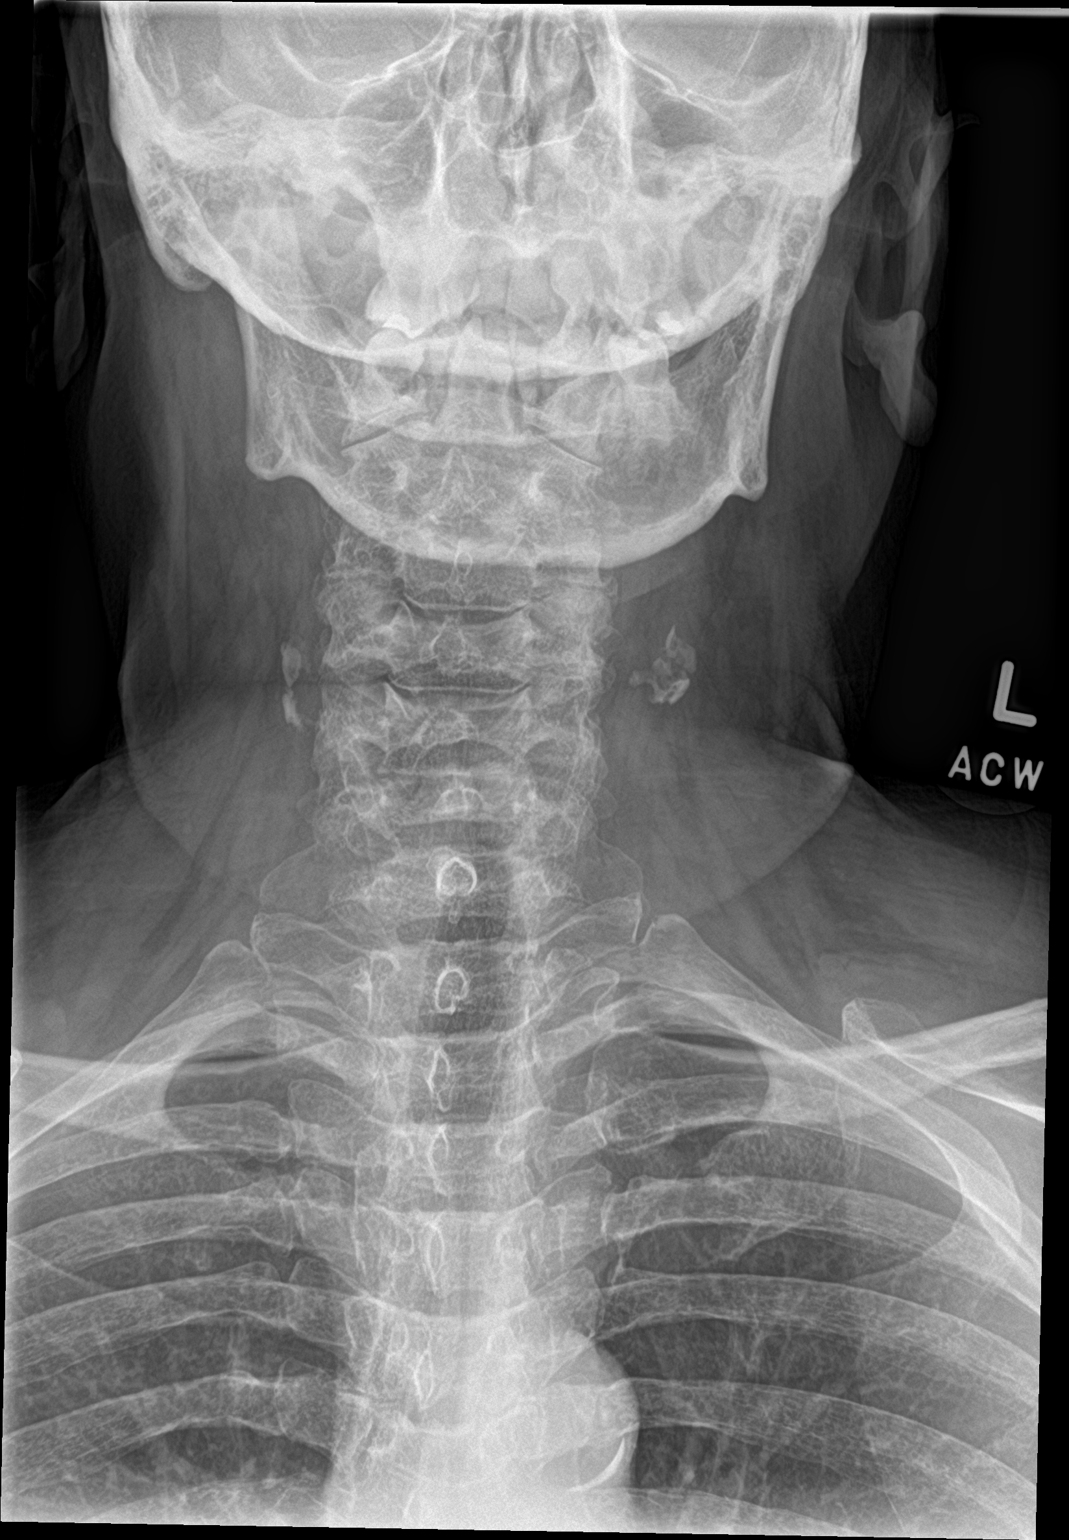

[2 of 2 positions shown; findings below may reference images not displayed]

FINDINGS: Two views study shows no prevertebral soft tissue swelling. No gas
in the prevertebral soft tissues are upper mediastinum. No
discernible radiopaque foreign body overlying the upper esophagus or
hypopharynx. Bones are demineralized.
IMPRESSION: Negative.
